# Patient Record
Sex: Female | Born: 1960 | Race: White | Hispanic: No | Marital: Married | Smoking: Never smoker
Health system: Southern US, Community
[De-identification: ages and names within clinical notes are randomized; demographics above are authoritative.]

## PROBLEM LIST (undated history)

## (undated) DIAGNOSIS — H269 Unspecified cataract: Secondary | ICD-10-CM

## (undated) DIAGNOSIS — T7840XA Allergy, unspecified, initial encounter: Secondary | ICD-10-CM

## (undated) DIAGNOSIS — M1990 Unspecified osteoarthritis, unspecified site: Secondary | ICD-10-CM

## (undated) HISTORY — PX: CATARACT EXTRACTION, BILATERAL: SHX1313

## (undated) HISTORY — DX: Unspecified osteoarthritis, unspecified site: M19.90

## (undated) HISTORY — DX: Allergy, unspecified, initial encounter: T78.40XA

## (undated) HISTORY — DX: Unspecified cataract: H26.9

---

## 2016-01-20 LAB — TSH: TSH: 2.96 (ref 0.41–5.90)

## 2016-01-20 LAB — LIPID PANEL
Cholesterol: 211 — AB (ref 0–200)
HDL: 65 (ref 35–70)
LDL Cholesterol: 131
TRIGLYCERIDES: 74 (ref 40–160)

## 2016-01-20 LAB — BASIC METABOLIC PANEL
BUN: 11 (ref 4–21)
CREATININE: 0.8 (ref 0.5–1.1)
GLUCOSE: 85
Potassium: 3.9 (ref 3.4–5.3)
SODIUM: 133 — AB (ref 137–147)

## 2017-09-17 DIAGNOSIS — M9902 Segmental and somatic dysfunction of thoracic region: Secondary | ICD-10-CM | POA: Diagnosis not present

## 2017-09-17 DIAGNOSIS — M9901 Segmental and somatic dysfunction of cervical region: Secondary | ICD-10-CM | POA: Diagnosis not present

## 2017-09-17 DIAGNOSIS — M5412 Radiculopathy, cervical region: Secondary | ICD-10-CM | POA: Diagnosis not present

## 2017-10-03 DIAGNOSIS — M722 Plantar fascial fibromatosis: Secondary | ICD-10-CM | POA: Diagnosis not present

## 2017-10-04 DIAGNOSIS — M9902 Segmental and somatic dysfunction of thoracic region: Secondary | ICD-10-CM | POA: Diagnosis not present

## 2017-10-04 DIAGNOSIS — M9901 Segmental and somatic dysfunction of cervical region: Secondary | ICD-10-CM | POA: Diagnosis not present

## 2017-10-04 DIAGNOSIS — M5412 Radiculopathy, cervical region: Secondary | ICD-10-CM | POA: Diagnosis not present

## 2017-10-30 DIAGNOSIS — M5412 Radiculopathy, cervical region: Secondary | ICD-10-CM | POA: Diagnosis not present

## 2017-10-30 DIAGNOSIS — M9902 Segmental and somatic dysfunction of thoracic region: Secondary | ICD-10-CM | POA: Diagnosis not present

## 2017-10-30 DIAGNOSIS — M9901 Segmental and somatic dysfunction of cervical region: Secondary | ICD-10-CM | POA: Diagnosis not present

## 2017-11-20 DIAGNOSIS — M5412 Radiculopathy, cervical region: Secondary | ICD-10-CM | POA: Diagnosis not present

## 2017-11-20 DIAGNOSIS — M9901 Segmental and somatic dysfunction of cervical region: Secondary | ICD-10-CM | POA: Diagnosis not present

## 2017-11-20 DIAGNOSIS — M9902 Segmental and somatic dysfunction of thoracic region: Secondary | ICD-10-CM | POA: Diagnosis not present

## 2017-12-11 DIAGNOSIS — M9901 Segmental and somatic dysfunction of cervical region: Secondary | ICD-10-CM | POA: Diagnosis not present

## 2017-12-11 DIAGNOSIS — M9902 Segmental and somatic dysfunction of thoracic region: Secondary | ICD-10-CM | POA: Diagnosis not present

## 2017-12-11 DIAGNOSIS — M5412 Radiculopathy, cervical region: Secondary | ICD-10-CM | POA: Diagnosis not present

## 2017-12-24 DIAGNOSIS — H02889 Meibomian gland dysfunction of unspecified eye, unspecified eyelid: Secondary | ICD-10-CM | POA: Diagnosis not present

## 2017-12-24 DIAGNOSIS — H26493 Other secondary cataract, bilateral: Secondary | ICD-10-CM | POA: Diagnosis not present

## 2018-01-01 DIAGNOSIS — M9902 Segmental and somatic dysfunction of thoracic region: Secondary | ICD-10-CM | POA: Diagnosis not present

## 2018-01-01 DIAGNOSIS — M5412 Radiculopathy, cervical region: Secondary | ICD-10-CM | POA: Diagnosis not present

## 2018-01-01 DIAGNOSIS — M9901 Segmental and somatic dysfunction of cervical region: Secondary | ICD-10-CM | POA: Diagnosis not present

## 2018-01-21 DIAGNOSIS — M9901 Segmental and somatic dysfunction of cervical region: Secondary | ICD-10-CM | POA: Diagnosis not present

## 2018-01-21 DIAGNOSIS — M5412 Radiculopathy, cervical region: Secondary | ICD-10-CM | POA: Diagnosis not present

## 2018-01-21 DIAGNOSIS — M9902 Segmental and somatic dysfunction of thoracic region: Secondary | ICD-10-CM | POA: Diagnosis not present

## 2018-02-11 DIAGNOSIS — M9902 Segmental and somatic dysfunction of thoracic region: Secondary | ICD-10-CM | POA: Diagnosis not present

## 2018-02-11 DIAGNOSIS — M9901 Segmental and somatic dysfunction of cervical region: Secondary | ICD-10-CM | POA: Diagnosis not present

## 2018-02-11 DIAGNOSIS — M5412 Radiculopathy, cervical region: Secondary | ICD-10-CM | POA: Diagnosis not present

## 2018-03-04 DIAGNOSIS — M9901 Segmental and somatic dysfunction of cervical region: Secondary | ICD-10-CM | POA: Diagnosis not present

## 2018-03-04 DIAGNOSIS — M5412 Radiculopathy, cervical region: Secondary | ICD-10-CM | POA: Diagnosis not present

## 2018-03-04 DIAGNOSIS — M9902 Segmental and somatic dysfunction of thoracic region: Secondary | ICD-10-CM | POA: Diagnosis not present

## 2018-03-25 DIAGNOSIS — M5412 Radiculopathy, cervical region: Secondary | ICD-10-CM | POA: Diagnosis not present

## 2018-03-25 DIAGNOSIS — M9901 Segmental and somatic dysfunction of cervical region: Secondary | ICD-10-CM | POA: Diagnosis not present

## 2018-03-25 DIAGNOSIS — M9902 Segmental and somatic dysfunction of thoracic region: Secondary | ICD-10-CM | POA: Diagnosis not present

## 2018-04-15 DIAGNOSIS — M9902 Segmental and somatic dysfunction of thoracic region: Secondary | ICD-10-CM | POA: Diagnosis not present

## 2018-04-15 DIAGNOSIS — M9901 Segmental and somatic dysfunction of cervical region: Secondary | ICD-10-CM | POA: Diagnosis not present

## 2018-04-15 DIAGNOSIS — M5412 Radiculopathy, cervical region: Secondary | ICD-10-CM | POA: Diagnosis not present

## 2018-04-25 ENCOUNTER — Ambulatory Visit (INDEPENDENT_AMBULATORY_CARE_PROVIDER_SITE_OTHER): Payer: 59 | Admitting: Family Medicine

## 2018-04-25 ENCOUNTER — Encounter: Payer: Self-pay | Admitting: Family Medicine

## 2018-04-25 VITALS — BP 118/78 | HR 74 | Temp 97.8°F | Resp 16 | Ht 62.0 in | Wt 115.8 lb

## 2018-04-25 DIAGNOSIS — Z130 Encounter for screening for diseases of the blood and blood-forming organs and certain disorders involving the immune mechanism: Secondary | ICD-10-CM

## 2018-04-25 DIAGNOSIS — Z13220 Encounter for screening for lipoid disorders: Secondary | ICD-10-CM

## 2018-04-25 DIAGNOSIS — N952 Postmenopausal atrophic vaginitis: Secondary | ICD-10-CM | POA: Diagnosis not present

## 2018-04-25 DIAGNOSIS — Z780 Asymptomatic menopausal state: Secondary | ICD-10-CM

## 2018-04-25 DIAGNOSIS — Z8262 Family history of osteoporosis: Secondary | ICD-10-CM | POA: Diagnosis not present

## 2018-04-25 DIAGNOSIS — Z803 Family history of malignant neoplasm of breast: Secondary | ICD-10-CM | POA: Insufficient documentation

## 2018-04-25 DIAGNOSIS — Z1239 Encounter for other screening for malignant neoplasm of breast: Secondary | ICD-10-CM

## 2018-04-25 DIAGNOSIS — Z1329 Encounter for screening for other suspected endocrine disorder: Secondary | ICD-10-CM

## 2018-04-25 LAB — CHLORIDE
CALCIUM: 9.1
CO2: 22
Chloride: 101

## 2018-04-25 NOTE — Progress Notes (Signed)
Patient: Rose Sandoval, Female    DOB: Dec 07, 1960, 57 y.o.   MRN: [id number] Visit Date: 04/25/2018  Today's Provider: Shirlee Latch, MD   Chief Complaint  Patient presents with  . New Patient (Initial Visit)   Subjective:    New Patient:  Rose Sandoval is a 57 y.o. female who presents today to Establish Care.  She feels well. She reports exercising includes walking. She reports she is sleeping well.  She was previously seen by Dr Sullivan Lone and then switched to [facility].  She would like to reestablish care at [facility].  She is postmenopausal.  She is using progesterone cream (OTC) for 3 weeks each month to help with hot flashes.  She is having some vaginal dryness and painful intercourse.  She is wondering about medication to treat this.  She is wary of HRT as her mother took this and had breast cancer.  She had a false positive on mammogram in the past and is wary of getting one again. She has never had DEXA and mother had osteoporosis also.  Has done FOBT colon cancer screening in 2017, but none since. -----------------------------------------------------------------   Review of Systems  Constitutional: Negative.   HENT: Negative.   Eyes: Positive for photophobia.       Cataracts-2018 both eyes  Respiratory: Negative.   Cardiovascular: Negative.   Gastrointestinal: Negative.   Endocrine: Negative.   Genitourinary: Positive for vaginal pain.  Musculoskeletal: Positive for arthralgias, back pain and neck stiffness.  Skin: Negative.   Allergic/Immunologic: Positive for environmental allergies.       Mild  Neurological: Positive for light-headedness and headaches.       Mild  Hematological: Negative.   Psychiatric/Behavioral: Negative.     Social History      She  reports that she has never smoked. She has never used smokeless tobacco. She reports that she does not drink alcohol or use drugs.       Social History   Socioeconomic History  . Marital status:  Married    Spouse name: Not on file  . Number of children: Not on file  . Years of education: Not on file  . Highest education level: Not on file  Occupational History  . Not on file  Social Needs  . Financial resource strain: Not on file  . Food insecurity:    Worry: Not on file    Inability: Not on file  . Transportation needs:    Medical: Not on file    Non-medical: Not on file  Tobacco Use  . Smoking status: Never Smoker  . Smokeless tobacco: Never Used  Substance and Sexual Activity  . Alcohol use: Never    Frequency: Never  . Drug use: Never  . Sexual activity: Not on file  Lifestyle  . Physical activity:    Days per week: Not on file    Minutes per session: Not on file  . Stress: Not on file  Relationships  . Social connections:    Talks on phone: Not on file    Gets together: Not on file    Attends religious service: Not on file    Active member of club or organization: Not on file    Attends meetings of clubs or organizations: Not on file    Relationship status: Not on file  Other Topics Concern  . Not on file  Social History Narrative  . Not on file    Past Medical History:  Diagnosis  Date  . Allergy   . Arthritis   . Cataract    2018     There are no active problems to display for this patient.   Past Surgical History:  Procedure Laterality Date  . EYE SURGERY      Family History        Family Status  Relation Name Status  . Mother  (Not Specified)  . Father  (Not Specified)  . Oneal Grout  (Not Specified)  . MGM  (Not Specified)  . PGF  (Not Specified)  . Brother  (Not Specified)  . MGF  (Not Specified)  . PGM  (Not Specified)        Her family history includes Cancer in her father, maternal grandmother, and mother; Dementia in her father and paternal grandmother; Emphysema in her maternal grandfather; Heart attack in her paternal grandfather and paternal uncle; Leukemia in her brother.      Not on File  No current outpatient  medications on file.   Patient Care Team: Erasmo Downer, MD as PCP - General (Family Medicine)      Objective:   Vitals: BP 118/78 (BP Location: Right Arm, Patient Position: Sitting, Cuff Size: Normal)   Pulse 74   Temp 97.8 F (36.6 C) (Oral)   Resp 16   Wt 115 lb 12.8 oz (52.5 kg)   SpO2 99%    Vitals:   04/25/18 1516  BP: 118/78  Pulse: 74  Resp: 16  Temp: 97.8 F (36.6 C)  TempSrc: Oral  SpO2: 99%  Weight: 115 lb 12.8 oz (52.5 kg)     Physical Exam  Constitutional: She is oriented to person, place, and time. She appears well-developed and well-nourished. No distress.  HENT:  Head: Normocephalic and atraumatic.  Right Ear: External ear normal.  Left Ear: External ear normal.  Nose: Nose normal.  Mouth/Throat: Oropharynx is clear and moist.  Eyes: Pupils are equal, round, and reactive to light. Conjunctivae and EOM are normal. No scleral icterus.  Neck: Neck supple. No thyromegaly present.  Cardiovascular: Normal rate, regular rhythm, normal heart sounds and intact distal pulses.  No murmur heard. Pulmonary/Chest: Effort normal and breath sounds normal. No respiratory distress. She has no wheezes. She has no rales.  Abdominal: Soft. Bowel sounds are normal. She exhibits no distension. There is no tenderness. There is no rebound and no guarding.  Musculoskeletal: She exhibits no edema or deformity.  Lymphadenopathy:    She has no cervical adenopathy.  Neurological: She is alert and oriented to person, place, and time.  Skin: Skin is warm and dry. Capillary refill takes less than 2 seconds. No rash noted.  Psychiatric: She has a normal mood and affect. Her behavior is normal.  Vitals reviewed.    Depression Screen PHQ 2/9 Scores 04/25/2018  PHQ - 2 Score 0  PHQ- 9 Score 0      Assessment & Plan:     Establish care  Exercise Activities and Dietary recommendations Goals   None      There is no immunization history on file for this  patient.  Health Maintenance  Topic Date Due  . Hepatitis C Screening  1961/04/27  . HIV Screening  07/05/1975  . TETANUS/TDAP  07/05/1979  . PAP SMEAR  07/04/1981  . MAMMOGRAM  07/04/2010  . COLONOSCOPY  07/04/2010  . INFLUENZA VACCINE  01/02/2018     Discussed health benefits of physical activity, and encouraged her to engage in regular exercise appropriate for her  age and condition.    Discussed importance of colon cancer screening.  Discussed options of colonoscopy, Cologuard, FOBT and given information about these options.  We will follow-up at her next visit, which will be a CPE to discuss going forward with some method of colon cancer screening at that time --------------------------------------------------------------------  Problem List Items Addressed This Visit      Genitourinary   Atrophic vaginitis - Primary    Discussed atrophic vaginitis and that it is common in postmenopausal women due to estrogen deficiency She will try coconut oil and lubrication We discussed possibility of using vaginal estrogen cream, patient is wary of any hormonal therapy, excluding over-the-counter progesterone cream she is using She will consider this again in the future if natural treatments do not help        Other   Family history of osteoporosis    Counseled patient significantly about her family history of osteoporosis She is also thin and postmenopausal puts her at risk for osteoporosis Discussed importance of weightbearing exercise Discussed calcium and vitamin D supplementation We will obtain baseline DEXA scan Pending DEXA scan results, consider further management      Relevant Orders   DG Bone Density   Family history of breast cancer    Long discussion with patient regarding importance of cancer screenings, especially mammograms, given her family history of breast cancer She does agree to mammogram, which was ordered today She will call to schedule this      Relevant  Orders   MM 3D SCREEN BREAST BILATERAL    Other Visit Diagnoses    Postmenopausal       Relevant Orders   DG Bone Density   Screening for breast cancer       Relevant Orders   MM 3D SCREEN BREAST BILATERAL   Screening for lipid disorders       Relevant Orders   Lipid panel   Comprehensive metabolic panel   Screening for thyroid disorder       Relevant Orders   TSH   Screening for deficiency anemia       Relevant Orders   CBC       Return in about 6 months (around 10/24/2018).   Addressed extensive list of chronic and acute medical problems today requiring extensive time in counseling and coordination of care.  Over half of this 45 minute visit were spent in counseling and coordinating care of multiple medical problems and importance of screenings.   The entirety of the information documented in the History of Present Illness, Review of Systems and Physical Exam were personally obtained by me. Portions of this information were initially documented by Presley RaddleNikki Walston and Dara LordsPorsha Carpenter, CMA and reviewed by me for thoroughness and accuracy.    Erasmo DownerBacigalupo, [place] Blizzard M, MD, MPH [facility] 04/25/2018 4:52 PM

## 2018-04-25 NOTE — Patient Instructions (Addendum)
For osteoporosis prevention:  Recommend regular weight bearing exercise, avoiding smoking, and adequate Ca (1262m/day) and Vit D3 (1000-2000 units daily) via diet or supplement.    Health Maintenance, Female Adopting a healthy lifestyle and getting preventive care can go a long way to promote health and wellness. Talk with your health care provider about what schedule of regular examinations is right for you. This is a good chance for you to check in with your provider about disease prevention and staying healthy. In between checkups, there are plenty of things you can do on your own. Experts have done a lot of research about which lifestyle changes and preventive measures are most likely to keep you healthy. Ask your health care provider for more information. Weight and diet Eat a healthy diet  Be sure to include plenty of vegetables, fruits, low-fat dairy products, and lean protein.  Do not eat a lot of foods high in solid fats, added sugars, or salt.  Get regular exercise. This is one of the most important things you can do for your health. ? Most adults should exercise for at least 150 minutes each week. The exercise should increase your heart rate and make you sweat (moderate-intensity exercise). ? Most adults should also do strengthening exercises at least twice a week. This is in addition to the moderate-intensity exercise.  Maintain a healthy weight  Body mass index (BMI) is a measurement that can be used to identify possible weight problems. It estimates body fat based on height and weight. Your health care provider can help determine your BMI and help you achieve or maintain a healthy weight.  For females 220years of age and older: ? A BMI below 18.5 is considered underweight. ? A BMI of 18.5 to 24.9 is normal. ? A BMI of 25 to 29.9 is considered overweight. ? A BMI of 30 and above is considered obese.  Watch levels of cholesterol and blood lipids  You should start having  your blood tested for lipids and cholesterol at 57years of age, then have this test every 5 years.  You may need to have your cholesterol levels checked more often if: ? Your lipid or cholesterol levels are high. ? You are older than 57years of age. ? You are at high risk for heart disease.  Cancer screening Lung Cancer  Lung cancer screening is recommended for adults [id number] old who are at high risk for lung cancer because of a history of smoking.  A yearly low-dose CT scan of the lungs is recommended for people who: ? Currently smoke. ? Have quit within the past 15 years. ? Have at least a 30-pack-year history of smoking. A pack year is smoking an average of one pack of cigarettes a day for 1 year.  Yearly screening should continue until it has been 15 years since you quit.  Yearly screening should stop if you develop a health problem that would prevent you from having lung cancer treatment.  Breast Cancer  Practice breast self-awareness. This means understanding how your breasts normally appear and feel.  It also means doing regular breast self-exams. Let your health care provider know about any changes, no matter how small.  If you are in your 20s or 30s, you should have a clinical breast exam (CBE) by a health care provider every 1-3 years as part of a regular health exam.  If you are 469or older, have a CBE every year. Also consider having a breast X-ray (mammogram)  every year.  If you have a family history of breast cancer, talk to your health care provider about genetic screening.  If you are at high risk for breast cancer, talk to your health care provider about having an MRI and a mammogram every year.  Breast cancer gene (BRCA) assessment is recommended for women who have family members with BRCA-related cancers. BRCA-related cancers include: ? Breast. ? Ovarian. ? Tubal. ? Peritoneal cancers.  Results of the assessment will determine the need for genetic  counseling and BRCA1 and BRCA2 testing.  Cervical Cancer Your health care provider may recommend that you be screened regularly for cancer of the pelvic organs (ovaries, uterus, and vagina). This screening involves a pelvic examination, including checking for microscopic changes to the surface of your cervix (Pap test). You may be encouraged to have this screening done every 3 years, beginning at age 55.  For women ages 39-65, health care providers may recommend pelvic exams and Pap testing every 3 years, or they may recommend the Pap and pelvic exam, combined with testing for human papilloma virus (HPV), every 5 years. Some types of HPV increase your risk of cervical cancer. Testing for HPV may also be done on women of any age with unclear Pap test results.  Other health care providers may not recommend any screening for nonpregnant women who are considered low risk for pelvic cancer and who do not have symptoms. Ask your health care provider if a screening pelvic exam is right for you.  If you have had past treatment for cervical cancer or a condition that could lead to cancer, you need Pap tests and screening for cancer for at least 20 years after your treatment. If Pap tests have been discontinued, your risk factors (such as having a new sexual partner) need to be reassessed to determine if screening should resume. Some women have medical problems that increase the chance of getting cervical cancer. In these cases, your health care provider may recommend more frequent screening and Pap tests.  Colorectal Cancer  This type of cancer can be detected and often prevented.  Routine colorectal cancer screening usually begins at 57 years of age and continues through 57 years of age.  Your health care provider may recommend screening at an earlier age if you have risk factors for colon cancer.  Your health care provider may also recommend using home test kits to check for hidden blood in the  stool.  A small camera at the end of a tube can be used to examine your colon directly (sigmoidoscopy or colonoscopy). This is done to check for the earliest forms of colorectal cancer.  Routine screening usually begins at age 1.  Direct examination of the colon should be repeated every 5-10 years through 57 years of age. However, you may need to be screened more often if early forms of precancerous polyps or small growths are found.  Skin Cancer  Check your skin from head to toe regularly.  Tell your health care provider about any new moles or changes in moles, especially if there is a change in a mole's shape or color.  Also tell your health care provider if you have a mole that is larger than the size of a pencil eraser.  Always use sunscreen. Apply sunscreen liberally and repeatedly throughout the day.  Protect yourself by wearing long sleeves, pants, a wide-brimmed hat, and sunglasses whenever you are outside.  Heart disease, diabetes, and high blood pressure  High blood pressure causes  heart disease and increases the risk of stroke. High blood pressure is more likely to develop in: ? People who have blood pressure in the high end of the normal range (130-139/85-89 mm Hg). ? People who are overweight or obese. ? People who are African American.  If you are 5-16 years of age, have your blood pressure checked every 3-5 years. If you are 49 years of age or older, have your blood pressure checked every year. You should have your blood pressure measured twice-once when you are at a hospital or clinic, and once when you are not at a hospital or clinic. Record the average of the two measurements. To check your blood pressure when you are not at a hospital or clinic, you can use: ? An automated blood pressure machine at a pharmacy. ? A home blood pressure monitor.  If you are between 13 years and 48 years old, ask your health care provider if you should take aspirin to prevent  strokes.  Have regular diabetes screenings. This involves taking a blood sample to check your fasting blood sugar level. ? If you are at a normal weight and have a low risk for diabetes, have this test once every three years after 57 years of age. ? If you are overweight and have a high risk for diabetes, consider being tested at a younger age or more often. Preventing infection Hepatitis B  If you have a higher risk for hepatitis B, you should be screened for this virus. You are considered at high risk for hepatitis B if: ? You were born in a country where hepatitis B is common. Ask your health care provider which countries are considered high risk. ? Your parents were born in a high-risk country, and you have not been immunized against hepatitis B (hepatitis B vaccine). ? You have HIV or AIDS. ? You use needles to inject street drugs. ? You live with someone who has hepatitis B. ? You have had sex with someone who has hepatitis B. ? You get hemodialysis treatment. ? You take certain medicines for conditions, including cancer, organ transplantation, and autoimmune conditions.  Hepatitis C  Blood testing is recommended for: ? Everyone born from [birth year] through 1965. ? Anyone with known risk factors for hepatitis C.  Sexually transmitted infections (STIs)  You should be screened for sexually transmitted infections (STIs) including gonorrhea and chlamydia if: ? You are sexually active and are younger than 57 years of age. ? You are older than 57 years of age and your health care provider tells you that you are at risk for this type of infection. ? Your sexual activity has changed since you were last screened and you are at an increased risk for chlamydia or gonorrhea. Ask your health care provider if you are at risk.  If you do not have HIV, but are at risk, it may be recommended that you take a prescription medicine daily to prevent HIV infection. This is called pre-exposure prophylaxis  (PrEP). You are considered at risk if: ? You are sexually active and do not regularly use condoms or know the HIV status of your partner(s). ? You take drugs by injection. ? You are sexually active with a partner who has HIV.  Talk with your health care provider about whether you are at high risk of being infected with HIV. If you choose to begin PrEP, you should first be tested for HIV. You should then be tested every 3 months for as long as  you are taking PrEP. Pregnancy  If you are premenopausal and you may become pregnant, ask your health care provider about preconception counseling.  If you may become pregnant, take 400 to 800 micrograms (mcg) of folic acid every day.  If you want to prevent pregnancy, talk to your health care provider about birth control (contraception). Osteoporosis and menopause  Osteoporosis is a disease in which the bones lose minerals and strength with aging. This can result in serious bone fractures. Your risk for osteoporosis can be identified using a bone density scan.  If you are 78 years of age or older, or if you are at risk for osteoporosis and fractures, ask your health care provider if you should be screened.  Ask your health care provider whether you should take a calcium or vitamin D supplement to lower your risk for osteoporosis.  Menopause may have certain physical symptoms and risks.  Hormone replacement therapy may reduce some of these symptoms and risks. Talk to your health care provider about whether hormone replacement therapy is right for you. Follow these instructions at home:  Schedule regular health, dental, and eye exams.  Stay current with your immunizations.  Do not use any tobacco products including cigarettes, chewing tobacco, or electronic cigarettes.  If you are pregnant, do not drink alcohol.  If you are breastfeeding, limit how much and how often you drink alcohol.  Limit alcohol intake to no more than 1 drink per day for  nonpregnant women. One drink equals 12 ounces of beer, 5 ounces of wine, or 1 ounces of hard liquor.  Do not use street drugs.  Do not share needles.  Ask your health care provider for help if you need support or information about quitting drugs.  Tell your health care provider if you often feel depressed.  Tell your health care provider if you have ever been abused or do not feel safe at home. This information is not intended to replace advice given to you by your health care provider. Make sure you discuss any questions you have with your health care provider. Document Released: 12/04/2010 Document Revised: 10/27/2015 Document Reviewed: 02/22/2015 Elsevier Interactive Patient Education  2018 [employer].    Atrophic Vaginitis Atrophic vaginitis is a condition in which the tissues that line the vagina become dry and thin. This condition is most common in women who have stopped having regular menstrual periods (menopause). This usually starts when a woman is 56-64 years old. Estrogen helps to keep the vagina moist. It stimulates the vagina to produce a clear fluid that lubricates the vagina for sexual intercourse. This fluid also protects the vagina from infection. Lack of estrogen can cause the lining of the vagina to get thinner and dryer. The vagina may also shrink in size. It may become less elastic. Atrophic vaginitis tends to get worse over time as a woman's estrogen level drops. What are the causes? This condition is caused by the normal drop in estrogen that happens around the time of menopause. What increases the risk? Certain conditions or situations may lower a woman's estrogen level, which increases her risk of atrophic vaginitis. These include:  Taking medicine that blocks estrogen.  Having ovaries removed surgically.  Being treated for cancer with X-ray treatment (radiation) or medicines (chemotherapy).  Exercising very hard and often.  Having an eating disorder  (anorexia).  Giving birth or breastfeeding.  Being over the age of 17.  Smoking.  What are the signs or symptoms? Symptoms of this condition include:  Pain, soreness, or bleeding during sexual intercourse (dyspareunia).  Vaginal burning, irritation, or itching.  Pain or bleeding during a vaginal examination using a speculum (pelvic exam).  Loss of interest in sexual activity.  Having burning pain when passing urine.  Vaginal discharge that is brown or yellow.  In some cases, there are no symptoms. How is this diagnosed? This condition is diagnosed with a medical history and physical exam. This will include a pelvic exam that checks whether the inside of your vagina appears pale, thin, or dry. Rarely, you may also have other tests, including:  A urine test.  A test that checks the acid balance in your vaginal fluid (acid balance test).  How is this treated? Treatment for this condition may depend on the severity of your symptoms. Treatment may include:  Using an over-the-counter vaginal lubricant before you have sexual intercourse.  Using a long-acting vaginal moisturizer.  Using low-dose vaginal estrogen for moderate to severe symptoms that do not respond to other treatments. Options include creams, tablets, and inserts (vaginal rings). Before using vaginal estrogen, tell your health care provider if you have a history of: ? Breast cancer. ? Endometrial cancer. ? Blood clots.  Taking medicines. You may be able to take a daily pill for dyspareunia. Discuss all of the risks of this medicine with your health care provider. It is usually not recommended for women who have a family history or personal history of breast cancer.  If your symptoms are very mild and you are not sexually active, you may not need treatment. Follow these instructions at home:  Take medicines only as directed by your health care provider. Do not use herbal or alternative medicines unless your  health care provider says that you can.  Use over-the-counter creams, lubricants, or moisturizers for dryness only as directed by your health care provider.  If your atrophic vaginitis is caused by menopause, discuss all of your menopausal symptoms and treatment options with your health care provider.  Do not douche.  Do not use products that can make your vagina dry. These include: ? Scented feminine sprays. ? Scented tampons. ? Scented soaps.  If it hurts to have sex, talk with your sexual partner. Contact a health care provider if:  Your discharge looks different than normal.  Your vagina has an unusual smell.  You have new symptoms.  Your symptoms do not improve with treatment.  Your symptoms get worse. This information is not intended to replace advice given to you by your health care provider. Make sure you discuss any questions you have with your health care provider. Document Released: 10/05/2014 Document Revised: 10/27/2015 Document Reviewed: 05/12/2014 Elsevier Interactive Patient Education  [employer].  Colonoscopy, Adult A colonoscopy is an exam to look at the entire large intestine. During the exam, a lubricated, bendable tube is inserted into the anus and then passed into the rectum, colon, and other parts of the large intestine. A colonoscopy is often done as a part of normal colorectal screening or in response to certain symptoms, such as anemia, persistent diarrhea, abdominal pain, and blood in the stool. The exam can help screen for and diagnose medical problems, including:  Tumors.  Polyps.  Inflammation.  Areas of bleeding.  Tell a health care provider about:  Any allergies you have.  All medicines you are taking, including vitamins, herbs, eye drops, creams, and over-the-counter medicines.  Any problems you or family members have had with anesthetic medicines.  Any blood disorders you have.  Any surgeries you have had.  Any medical  conditions you have.  Any problems you have had passing stool. What are the risks? Generally, this is a safe procedure. However, problems may occur, including:  Bleeding.  A tear in the intestine.  A reaction to medicines given during the exam.  Infection (rare).  What happens before the procedure? Eating and drinking restrictions Follow instructions from your health care provider about eating and drinking, which may include:  A few days before the procedure - follow a low-fiber diet. Avoid nuts, seeds, dried fruit, raw fruits, and vegetables.  1-3 days before the procedure - follow a clear liquid diet. Drink only clear liquids, such as clear broth or bouillon, black coffee or tea, clear juice, clear soft drinks or sports drinks, gelatin dessert, and popsicles. Avoid any liquids that contain red or purple dye.  On the day of the procedure - do not eat or drink anything during the 2 hours before the procedure, or within the time period that your health care provider recommends.  Bowel prep If you were prescribed an oral bowel prep to clean out your colon:  Take it as told by your health care provider. Starting the day before your procedure, you will need to drink a large amount of medicated liquid. The liquid will cause you to have multiple loose stools until your stool is almost clear or light green.  If your skin or anus gets irritated from diarrhea, you may use these to relieve the irritation: ? Medicated wipes, such as adult wet wipes with aloe and vitamin E. ? A skin soothing-product like petroleum jelly.  If you vomit while drinking the bowel prep, take a break for up to 60 minutes and then begin the bowel prep again. If vomiting continues and you cannot take the bowel prep without vomiting, call your health care provider.  General instructions  Ask your health care provider about changing or stopping your regular medicines. This is especially important if you are taking  diabetes medicines or blood thinners.  Plan to have someone take you home from the hospital or clinic. What happens during the procedure?  An IV tube may be inserted into one of your veins.  You will be given medicine to help you relax (sedative).  To reduce your risk of infection: ? Your health care team will wash or sanitize their hands. ? Your anal area will be washed with soap.  You will be asked to lie on your side with your knees bent.  Your health care provider will lubricate a long, thin, flexible tube. The tube will have a camera and a light on the end.  The tube will be inserted into your anus.  The tube will be gently eased through your rectum and colon.  Air will be delivered into your colon to keep it open. You may feel some pressure or cramping.  The camera will be used to take images during the procedure.  A small tissue sample may be removed from your body to be examined under a microscope (biopsy). If any potential problems are found, the tissue will be sent to a lab for testing.  If small polyps are found, your health care provider may remove them and have them checked for cancer cells.  The tube that was inserted into your anus will be slowly removed. The procedure may vary among health care providers and hospitals. What happens after the procedure?  Your blood pressure, heart rate, breathing rate, and blood  oxygen level will be monitored until the medicines you were given have worn off.  Do not drive for 24 hours after the exam.  You may have a small amount of blood in your stool.  You may pass gas and have mild abdominal cramping or bloating due to the air that was used to inflate your colon during the exam.  It is up to you to get the results of your procedure. Ask your health care provider, or the department performing the procedure, when your results will be ready. This information is not intended to replace advice given to you by your health care  provider. Make sure you discuss any questions you have with your health care provider. Document Released: 05/18/2000 Document Revised: 03/21/2016 Document Reviewed: 08/02/2015 Elsevier Interactive Patient Education  2018 [employer].

## 2018-04-25 NOTE — Assessment & Plan Note (Signed)
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

## 2018-04-25 NOTE — Assessment & Plan Note (Signed)
Long discussion with patient regarding importance of cancer screenings, especially mammograms, given her family history of breast cancer She does agree to mammogram, which was ordered today She will call to schedule this

## 2018-04-25 NOTE — Assessment & Plan Note (Signed)
Counseled patient significantly about her family history of osteoporosis She is also thin and postmenopausal puts her at risk for osteoporosis Discussed importance of weightbearing exercise Discussed calcium and vitamin D supplementation We will obtain baseline DEXA scan Pending DEXA scan results, consider further management

## 2018-05-06 DIAGNOSIS — M9901 Segmental and somatic dysfunction of cervical region: Secondary | ICD-10-CM | POA: Diagnosis not present

## 2018-05-06 DIAGNOSIS — M5412 Radiculopathy, cervical region: Secondary | ICD-10-CM | POA: Diagnosis not present

## 2018-05-06 DIAGNOSIS — M9902 Segmental and somatic dysfunction of thoracic region: Secondary | ICD-10-CM | POA: Diagnosis not present

## 2018-05-16 DIAGNOSIS — Z130 Encounter for screening for diseases of the blood and blood-forming organs and certain disorders involving the immune mechanism: Secondary | ICD-10-CM | POA: Diagnosis not present

## 2018-05-16 DIAGNOSIS — Z1329 Encounter for screening for other suspected endocrine disorder: Secondary | ICD-10-CM | POA: Diagnosis not present

## 2018-05-16 DIAGNOSIS — Z13220 Encounter for screening for lipoid disorders: Secondary | ICD-10-CM | POA: Diagnosis not present

## 2018-05-17 LAB — CBC
Hematocrit: 38.5 % (ref 34.0–46.6)
Hemoglobin: 12.1 g/dL (ref 11.1–15.9)
MCH: 28.1 pg (ref 26.6–33.0)
MCHC: 31.4 g/dL — ABNORMAL LOW (ref 31.5–35.7)
MCV: 90 fL (ref 79–97)
Platelets: 279 10*3/uL (ref 150–450)
RBC: 4.3 x10E6/uL (ref 3.77–5.28)
RDW: 12.1 % — ABNORMAL LOW (ref 12.3–15.4)
WBC: 5.1 10*3/uL (ref 3.4–10.8)

## 2018-05-17 LAB — COMPREHENSIVE METABOLIC PANEL
ALBUMIN: 4.2 g/dL (ref 3.5–5.5)
ALT: 15 IU/L (ref 0–32)
AST: 16 IU/L (ref 0–40)
Albumin/Globulin Ratio: 1.7 (ref 1.2–2.2)
Alkaline Phosphatase: 59 IU/L (ref 39–117)
BUN / CREAT RATIO: 21 (ref 9–23)
BUN: 15 mg/dL (ref 6–24)
Bilirubin Total: 0.3 mg/dL (ref 0.0–1.2)
CALCIUM: 9.4 mg/dL (ref 8.7–10.2)
CO2: 22 mmol/L (ref 20–29)
CREATININE: 0.73 mg/dL (ref 0.57–1.00)
Chloride: 103 mmol/L (ref 96–106)
GFR calc Af Amer: 106 mL/min/{1.73_m2} (ref >59)
GFR, EST NON AFRICAN AMERICAN: 92 mL/min/{1.73_m2} (ref >59)
GLOBULIN, TOTAL: 2.5 g/dL (ref 1.5–4.5)
Glucose: 90 mg/dL (ref 65–99)
Potassium: 4.3 mmol/L (ref 3.5–5.2)
SODIUM: 139 mmol/L (ref 134–144)
Total Protein: 6.7 g/dL (ref 6.0–8.5)

## 2018-05-17 LAB — LIPID PANEL
CHOLESTEROL TOTAL: 187 mg/dL (ref 100–199)
Chol/HDL Ratio: 2.7 ratio (ref 0.0–4.4)
HDL: 69 mg/dL (ref >39)
LDL Calculated: 100 mg/dL — ABNORMAL HIGH (ref 0–99)
Triglycerides: 91 mg/dL (ref 0–149)
VLDL Cholesterol Cal: 18 mg/dL (ref 5–40)

## 2018-05-17 LAB — TSH: TSH: 1.66 u[IU]/mL (ref 0.450–4.500)

## 2018-05-19 ENCOUNTER — Telehealth: Payer: Self-pay

## 2018-05-19 NOTE — Telephone Encounter (Signed)
-----   Message from Erasmo DownerAngela M Bacigalupo, MD sent at 05/19/2018  9:43 AM EST ----- Cholesterol has improved and is in a good range.  Normal kidney function, liver function, electrolytes, Blood counts, Thyroid function

## 2018-05-19 NOTE — Telephone Encounter (Signed)
LVMTRC 

## 2018-05-19 NOTE — Telephone Encounter (Signed)
Patient advised as below.  

## 2018-05-30 DIAGNOSIS — M9901 Segmental and somatic dysfunction of cervical region: Secondary | ICD-10-CM | POA: Diagnosis not present

## 2018-05-30 DIAGNOSIS — M5412 Radiculopathy, cervical region: Secondary | ICD-10-CM | POA: Diagnosis not present

## 2018-05-30 DIAGNOSIS — M9902 Segmental and somatic dysfunction of thoracic region: Secondary | ICD-10-CM | POA: Diagnosis not present

## 2018-06-24 DIAGNOSIS — M9902 Segmental and somatic dysfunction of thoracic region: Secondary | ICD-10-CM | POA: Diagnosis not present

## 2018-06-24 DIAGNOSIS — M9901 Segmental and somatic dysfunction of cervical region: Secondary | ICD-10-CM | POA: Diagnosis not present

## 2018-06-24 DIAGNOSIS — M5412 Radiculopathy, cervical region: Secondary | ICD-10-CM | POA: Diagnosis not present

## 2018-07-16 ENCOUNTER — Other Ambulatory Visit: Payer: Self-pay

## 2018-07-22 DIAGNOSIS — M9902 Segmental and somatic dysfunction of thoracic region: Secondary | ICD-10-CM | POA: Diagnosis not present

## 2018-07-22 DIAGNOSIS — M5412 Radiculopathy, cervical region: Secondary | ICD-10-CM | POA: Diagnosis not present

## 2018-07-22 DIAGNOSIS — M9901 Segmental and somatic dysfunction of cervical region: Secondary | ICD-10-CM | POA: Diagnosis not present

## 2018-08-19 DIAGNOSIS — M9902 Segmental and somatic dysfunction of thoracic region: Secondary | ICD-10-CM | POA: Diagnosis not present

## 2018-08-19 DIAGNOSIS — M5412 Radiculopathy, cervical region: Secondary | ICD-10-CM | POA: Diagnosis not present

## 2018-08-19 DIAGNOSIS — M9901 Segmental and somatic dysfunction of cervical region: Secondary | ICD-10-CM | POA: Diagnosis not present

## 2018-08-27 ENCOUNTER — Other Ambulatory Visit: Payer: Self-pay

## 2018-09-09 DIAGNOSIS — M9902 Segmental and somatic dysfunction of thoracic region: Secondary | ICD-10-CM | POA: Diagnosis not present

## 2018-09-09 DIAGNOSIS — M9901 Segmental and somatic dysfunction of cervical region: Secondary | ICD-10-CM | POA: Diagnosis not present

## 2018-09-09 DIAGNOSIS — M5412 Radiculopathy, cervical region: Secondary | ICD-10-CM | POA: Diagnosis not present

## 2018-10-02 ENCOUNTER — Other Ambulatory Visit: Payer: Self-pay

## 2018-11-10 ENCOUNTER — Encounter: Payer: Self-pay | Admitting: Family Medicine

## 2018-11-10 ENCOUNTER — Other Ambulatory Visit: Payer: Self-pay

## 2018-11-10 ENCOUNTER — Ambulatory Visit (INDEPENDENT_AMBULATORY_CARE_PROVIDER_SITE_OTHER): Payer: 59 | Admitting: Family Medicine

## 2018-11-10 ENCOUNTER — Other Ambulatory Visit (HOSPITAL_COMMUNITY)
Admission: RE | Admit: 2018-11-10 | Discharge: 2018-11-10 | Disposition: A | Discharge status: Home / Self Care | Payer: 59 | Source: Ambulatory Visit | Attending: Family Medicine | Admitting: Family Medicine

## 2018-11-10 VITALS — BP 91/66 | HR 80 | Temp 98.0°F | Ht 62.0 in | Wt 117.4 lb

## 2018-11-10 DIAGNOSIS — Z124 Encounter for screening for malignant neoplasm of cervix: Secondary | ICD-10-CM | POA: Insufficient documentation

## 2018-11-10 DIAGNOSIS — Z1211 Encounter for screening for malignant neoplasm of colon: Secondary | ICD-10-CM | POA: Diagnosis not present

## 2018-11-10 DIAGNOSIS — B351 Tinea unguium: Secondary | ICD-10-CM | POA: Diagnosis not present

## 2018-11-10 DIAGNOSIS — Z0000 Encounter for general adult medical examination without abnormal findings: Secondary | ICD-10-CM

## 2018-11-10 MED ORDER — EFINACONAZOLE 10 % EX SOLN: CUTANEOUS | 5 refills | Status: DC

## 2018-11-10 NOTE — Progress Notes (Signed)
Patient: Rose Sandoval, Female    DOB: 10-17-1960, 58 y.o.   MRN: [id number] Visit Date: 11/10/2018  Today's Provider: Lavon Paganini, MD   Chief Complaint  Patient presents with  . Annual Exam   Subjective:     Annual physical exam Rose Sandoval is a 58 y.o. female who presents today for health maintenance and complete physical. She feels well. She reports exercising lightly. She reports she is sleeping fairly well.   Has mammogram and DEXA scheduled for later this month - was postponed due to COVID pandemic  Last pap smear many years  Has never had colonoscopy.  Thinks she did FOBT x3 several yearas ago and it was negative. -----------------------------------------------------------------   Review of Systems  Constitutional: Negative.   HENT: Negative.   Eyes: Negative.   Respiratory: Negative.   Cardiovascular: Negative.   Gastrointestinal: Negative.   Endocrine: Negative.   Genitourinary: Negative.   Musculoskeletal: Negative.   Skin: Negative.   Allergic/Immunologic: Negative.   Neurological: Negative.   Hematological: Negative.   Psychiatric/Behavioral: Negative.     Social History      She  reports that she has never smoked. She has never used smokeless tobacco. She reports that she does not drink alcohol or use drugs.       Social History   Socioeconomic History  . Marital status: Married    Spouse name: Not on file  . Number of children: Not on file  . Years of education: Not on file  . Highest education level: Not on file  Occupational History  . Occupation: Pharmacist, hospital (middle school)  Social Needs  . Financial resource strain: Not on file  . Food insecurity:    Worry: Not on file    Inability: Not on file  . Transportation needs:    Medical: Not on file    Non-medical: Not on file  Tobacco Use  . Smoking status: Never Smoker  . Smokeless tobacco: Never Used  Substance and Sexual Activity  . Alcohol use: Never    Frequency: Never   . Drug use: Never  . Sexual activity: Not on file  Lifestyle  . Physical activity:    Days per week: Not on file    Minutes per session: Not on file  . Stress: Not on file  Relationships  . Social connections:    Talks on phone: Not on file    Gets together: Not on file    Attends religious service: Not on file    Active member of club or organization: Not on file    Attends meetings of clubs or organizations: Not on file    Relationship status: Not on file  Other Topics Concern  . Not on file  Social History Narrative  . Not on file    Past Medical History:  Diagnosis Date  . Allergy   . Arthritis   . Cataract    2018     Patient Active Problem List   Diagnosis Date Noted  . Atrophic vaginitis 04/25/2018  . Family history of osteoporosis 04/25/2018  . Family history of breast cancer 04/25/2018    Past Surgical History:  Procedure Laterality Date  . CATARACT EXTRACTION, BILATERAL     with lens replacement    Family History        Family Status  Relation Name Status  . Mother  Alive  . Father  Deceased  . Annamarie Major  (Not Specified)  . MGM  (  Not Specified)  . PGF  (Not Specified)  . Brother  (Not Specified)  . MGF  (Not Specified)  . PGM  (Not Specified)        Her family history includes Breast cancer in her mother; Cancer in her maternal grandmother; Dementia in her father and paternal grandmother; Emphysema in her maternal grandfather; Heart attack in her paternal grandfather and paternal uncle; Leukemia in her brother; Osteoporosis in her mother; Prostate cancer in her father.      No Known Allergies   Current Outpatient Medications:  .  Progesterone Micronized 10 % CREA, Place 1 application onto the skin daily., Disp: , Rfl:  .  Efinaconazole 10 % SOLN, Apply topically to affected toenails daily for 48 weeks, Disp: 8 mL, Rfl: 5   Patient Care Team: Erasmo DownerBacigalupo, Raymona Boss M, MD as PCP - General (Family Medicine)    Objective:    Vitals: BP 91/66  (BP Location: Right Arm, Patient Position: Sitting, Cuff Size: Normal)   Pulse 80   Temp 98 F (36.7 C) (Oral)   Ht 5\' 2"  (1.575 m)   Wt 117 lb 6.4 oz (53.3 kg)   SpO2 99%   BMI 21.47 kg/m    Vitals:   11/10/18 1008  BP: 91/66  Pulse: 80  Temp: 98 F (36.7 C)  TempSrc: Oral  SpO2: 99%  Weight: 117 lb 6.4 oz (53.3 kg)  Height: 5\' 2"  (1.575 m)     Physical Exam Vitals signs reviewed.  Constitutional:      General: She is not in acute distress.    Appearance: Normal appearance. She is well-developed. She is not diaphoretic.  HENT:     Head: Normocephalic and atraumatic.     Right Ear: Tympanic membrane, ear canal and external ear normal.     Left Ear: Tympanic membrane, ear canal and external ear normal.     Nose: Nose normal.     Mouth/Throat:     Mouth: Mucous membranes are moist.     Pharynx: Oropharynx is clear. No oropharyngeal exudate.  Eyes:     General: No scleral icterus.    Conjunctiva/sclera: Conjunctivae normal.     Pupils: Pupils are equal, round, and reactive to light.  Neck:     Musculoskeletal: Neck supple.     Thyroid: No thyromegaly.  Cardiovascular:     Rate and Rhythm: Normal rate and regular rhythm.     Pulses: Normal pulses.     Heart sounds: Normal heart sounds. No murmur.  Pulmonary:     Effort: Pulmonary effort is normal. No respiratory distress.     Breath sounds: Normal breath sounds. No wheezing or rales.  Abdominal:     General: Bowel sounds are normal. There is no distension.     Palpations: Abdomen is soft.     Tenderness: There is no abdominal tenderness. There is no guarding or rebound.  Musculoskeletal:        General: No deformity.     Right lower leg: No edema.     Left lower leg: No edema.  Lymphadenopathy:     Cervical: No cervical adenopathy.  Skin:    General: Skin is warm and dry.     Capillary Refill: Capillary refill takes less than 2 seconds.     Findings: No rash.     Comments: Small sebs over legs Thickened and  discolored 2nd toenails on bilateral feet  Neurological:     Mental Status: She is alert and oriented to person, place,  and time. Mental status is at baseline.  Psychiatric:        Mood and Affect: Mood normal.        Behavior: Behavior normal.        Thought Content: Thought content normal.     Depression Screen PHQ 2/9 Scores 11/10/2018 04/25/2018  PHQ - 2 Score 0 0  PHQ- 9 Score 1 0      Assessment & Plan:     Routine Health Maintenance and Physical Exam  Exercise Activities and Dietary recommendations Goals   None      There is no immunization history on file for this patient.  Health Maintenance  Topic Date Due  . Hepatitis C Screening  Apr [birth year], 1961  . HIV Screening  07/05/1975  . TETANUS/TDAP  07/05/1979  . PAP SMEAR-Modifier  07/04/1981  . MAMMOGRAM  07/04/2010  . COLONOSCOPY  07/04/2010  . INFLUENZA VACCINE  01/03/2019     Discussed health benefits of physical activity, and encouraged her to engage in regular exercise appropriate for her age and condition.    --------------------------------------------------------------------  Problem List Items Addressed This Visit    None    Visit Diagnoses    Encounter for annual physical exam    -  Primary   Screen for colon cancer       Relevant Orders   Ambulatory referral to Gastroenterology   Screening for cervical cancer       Relevant Orders   Cytology - PAP   Onychomycosis       Relevant Medications   Efinaconazole 10 % SOLN       Return in about 1 year (around 11/10/2019) for CPE.   The entirety of the information documented in the History of Present Illness, Review of Systems and Physical Exam were personally obtained by me. Portions of this information were initially documented by Presley RaddleNikki Walston, CMA and reviewed by me for thoroughness and accuracy.    Nataley Bahri, Marzella SchleinAngela M, MD MPH [facility]

## 2018-11-10 NOTE — Patient Instructions (Signed)
Preventive Care 40-64 Years, Female Preventive care refers to lifestyle choices and visits with your health care provider that can promote health and wellness. What does preventive care include?   A yearly physical exam. This is also called an annual well check.  Dental exams once or twice a year.  Routine eye exams. Ask your health care provider how often you should have your eyes checked.  Personal lifestyle choices, including: ? Daily care of your teeth and gums. ? Regular physical activity. ? Eating a healthy diet. ? Avoiding tobacco and drug use. ? Limiting alcohol use. ? Practicing safe sex. ? Taking low-dose aspirin daily starting at age 50. ? Taking vitamin and mineral supplements as recommended by your health care provider. What happens during an annual well check? The services and screenings done by your health care provider during your annual well check will depend on your age, overall health, lifestyle risk factors, and family history of disease. Counseling Your health care provider may ask you questions about your:  Alcohol use.  Tobacco use.  Drug use.  Emotional well-being.  Home and relationship well-being.  Sexual activity.  Eating habits.  Work and work environment.  Method of birth control.  Menstrual cycle.  Pregnancy history. Screening You may have the following tests or measurements:  Height, weight, and BMI.  Blood pressure.  Lipid and cholesterol levels. These may be checked every 5 years, or more frequently if you are over 50 years old.  Skin check.  Lung cancer screening. You may have this screening every year starting at age 55 if you have a 30-pack-year history of smoking and currently smoke or have quit within the past 15 years.  Colorectal cancer screening. All adults should have this screening starting at age 50 and continuing until age 75. Your health care provider may recommend screening at age 45. You will have tests every  1-10 years, depending on your results and the type of screening test. People at increased risk should start screening at an earlier age. Screening tests may include: ? Guaiac-based fecal occult blood testing. ? Fecal immunochemical test (FIT). ? Stool DNA test. ? Virtual colonoscopy. ? Sigmoidoscopy. During this test, a flexible tube with a tiny camera (sigmoidoscope) is used to examine your rectum and lower colon. The sigmoidoscope is inserted through your anus into your rectum and lower colon. ? Colonoscopy. During this test, a long, thin, flexible tube with a tiny camera (colonoscope) is used to examine your entire colon and rectum.  Hepatitis C blood test.  Hepatitis B blood test.  Sexually transmitted disease (STD) testing.  Diabetes screening. This is done by checking your blood sugar (glucose) after you have not eaten for a while (fasting). You may have this done every 1-3 years.  Mammogram. This may be done every 1-2 years. Talk to your health care provider about when you should start having regular mammograms. This may depend on whether you have a family history of breast cancer.  BRCA-related cancer screening. This may be done if you have a family history of breast, ovarian, tubal, or peritoneal cancers.  Pelvic exam and Pap test. This may be done every 3 years starting at age 21. Starting at age 30, this may be done every 5 years if you have a Pap test in combination with an HPV test.  Bone density scan. This is done to screen for osteoporosis. You may have this scan if you are at high risk for osteoporosis. Discuss your test results, treatment options,   and if necessary, the need for more tests with your health care provider. Vaccines Your health care provider may recommend certain vaccines, such as:  Influenza vaccine. This is recommended every year.  Tetanus, diphtheria, and acellular pertussis (Tdap, Td) vaccine. You may need a Td booster every 10 years.  Varicella  vaccine. You may need this if you have not been vaccinated.  Zoster vaccine. You may need this after age 38.  Measles, mumps, and rubella (MMR) vaccine. You may need at least one dose of MMR if you were born in 1957 or later. You may also need a second dose.  Pneumococcal 13-valent conjugate (PCV13) vaccine. You may need this if you have certain conditions and were not previously vaccinated.  Pneumococcal polysaccharide (PPSV23) vaccine. You may need one or two doses if you smoke cigarettes or if you have certain conditions.  Meningococcal vaccine. You may need this if you have certain conditions.  Hepatitis A vaccine. You may need this if you have certain conditions or if you travel or work in places where you may be exposed to hepatitis A.  Hepatitis B vaccine. You may need this if you have certain conditions or if you travel or work in places where you may be exposed to hepatitis B.  Haemophilus influenzae type b (Hib) vaccine. You may need this if you have certain conditions. Talk to your health care provider about which screenings and vaccines you need and how often you need them. This information is not intended to replace advice given to you by your health care provider. Make sure you discuss any questions you have with your health care provider. Document Released: 06/17/2015 Document Revised: 07/11/2017 Document Reviewed: 03/22/2015 Elsevier Interactive Patient Education  2019 [employer].

## 2018-11-11 LAB — CYTOLOGY - PAP
Diagnosis: NEGATIVE
HPV: NOT DETECTED

## 2018-11-21 ENCOUNTER — Ambulatory Visit
Admission: RE | Admit: 2018-11-21 | Discharge: 2018-11-21 | Disposition: A | Discharge status: Home / Self Care | Payer: 59 | Source: Ambulatory Visit | Attending: Family Medicine | Admitting: Family Medicine

## 2018-11-21 ENCOUNTER — Other Ambulatory Visit: Payer: Self-pay

## 2018-11-21 DIAGNOSIS — Z8262 Family history of osteoporosis: Secondary | ICD-10-CM | POA: Insufficient documentation

## 2018-11-21 DIAGNOSIS — Z803 Family history of malignant neoplasm of breast: Secondary | ICD-10-CM | POA: Diagnosis present

## 2018-11-21 DIAGNOSIS — Z1239 Encounter for other screening for malignant neoplasm of breast: Secondary | ICD-10-CM | POA: Diagnosis present

## 2018-11-21 DIAGNOSIS — Z780 Asymptomatic menopausal state: Secondary | ICD-10-CM | POA: Diagnosis present

## 2018-11-24 ENCOUNTER — Other Ambulatory Visit: Payer: Self-pay | Admitting: Family Medicine

## 2018-11-24 DIAGNOSIS — R921 Mammographic calcification found on diagnostic imaging of breast: Secondary | ICD-10-CM

## 2018-11-24 DIAGNOSIS — R928 Other abnormal and inconclusive findings on diagnostic imaging of breast: Secondary | ICD-10-CM

## 2018-12-03 ENCOUNTER — Ambulatory Visit
Admission: RE | Admit: 2018-12-03 | Discharge: 2018-12-03 | Disposition: A | Discharge status: Home / Self Care | Payer: 59 | Source: Ambulatory Visit | Attending: Family Medicine | Admitting: Family Medicine

## 2018-12-03 ENCOUNTER — Other Ambulatory Visit: Payer: Self-pay

## 2018-12-03 DIAGNOSIS — R928 Other abnormal and inconclusive findings on diagnostic imaging of breast: Secondary | ICD-10-CM | POA: Diagnosis present

## 2018-12-03 DIAGNOSIS — R921 Mammographic calcification found on diagnostic imaging of breast: Secondary | ICD-10-CM | POA: Diagnosis present

## 2018-12-04 ENCOUNTER — Other Ambulatory Visit: Payer: Self-pay | Admitting: Family Medicine

## 2018-12-04 DIAGNOSIS — R921 Mammographic calcification found on diagnostic imaging of breast: Secondary | ICD-10-CM

## 2018-12-04 DIAGNOSIS — R928 Other abnormal and inconclusive findings on diagnostic imaging of breast: Secondary | ICD-10-CM

## 2018-12-11 ENCOUNTER — Ambulatory Visit
Admission: RE | Admit: 2018-12-11 | Discharge: 2018-12-11 | Disposition: A | Discharge status: Home / Self Care | Payer: 59 | Source: Ambulatory Visit | Attending: Family Medicine | Admitting: Family Medicine

## 2018-12-11 DIAGNOSIS — R921 Mammographic calcification found on diagnostic imaging of breast: Secondary | ICD-10-CM

## 2018-12-11 DIAGNOSIS — R928 Other abnormal and inconclusive findings on diagnostic imaging of breast: Secondary | ICD-10-CM

## 2018-12-11 HISTORY — PX: BREAST BIOPSY: SHX20

## 2018-12-15 LAB — SURGICAL PATHOLOGY

## 2018-12-31 ENCOUNTER — Encounter: Payer: Self-pay | Admitting: *Deleted

## 2019-03-12 ENCOUNTER — Telehealth (INDEPENDENT_AMBULATORY_CARE_PROVIDER_SITE_OTHER): Payer: 59 | Admitting: Physician Assistant

## 2019-03-12 ENCOUNTER — Other Ambulatory Visit: Payer: Self-pay

## 2019-03-12 VITALS — Temp 99.8°F

## 2019-03-12 DIAGNOSIS — R05 Cough: Secondary | ICD-10-CM

## 2019-03-12 DIAGNOSIS — R059 Cough, unspecified: Secondary | ICD-10-CM

## 2019-03-12 DIAGNOSIS — Z20822 Contact with and (suspected) exposure to covid-19: Secondary | ICD-10-CM

## 2019-03-12 NOTE — Progress Notes (Signed)
Patient: Rose Sandoval Female    DOB: 21-Jun-1960   58 y.o.   MRN: [id number] Visit Date: 03/12/2019  Today's Provider: Trey Sailors, PA-C   Chief Complaint  Patient presents with  . Fever   Subjective:    Virtual Visit via Video Note  I connected with Rose Sandoval on 03/12/19 at  1:20 PM EDT by a video enabled telemedicine application and verified that I am speaking with the correct person using two identifiers.  Location: Patient: Home Provider: Office    HPI   Patient reports fever and sore throat since Tuesday afternoon. Patient reports fever of 100.4 and reports myalgias. 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 She reports there have been a few other cases but she is not she what her level of contact was. Denies Sob, denies coughing.     No Known Allergies   Current Outpatient Medications:  .  Ascorbic Acid (VITA-C PO), Take by mouth., Disp: , Rfl:  .  Multiple Vitamins-Minerals (ZINC PO), Take by mouth., Disp: , Rfl:  .  OIL OF OREGANO PO, Take by mouth., Disp: , Rfl:  .  Progesterone Micronized 10 % CREA, Place 1 application onto the skin daily., Disp: , Rfl:  .  VITAMIN D PO, Take by mouth., Disp: , Rfl:  .  Efinaconazole 10 % SOLN, Apply topically to affected toenails daily for 48 weeks (Patient not taking: Reported on 03/12/2019), Disp: 8 mL, Rfl: 5  Review of Systems  Constitutional: Negative for appetite change, chills, fatigue and fever.  Respiratory: Negative for chest tightness and shortness of breath.   Cardiovascular: Negative for chest pain and palpitations.  Gastrointestinal: Negative for abdominal pain, nausea and vomiting.  Neurological: Negative for dizziness and weakness.    Social History   Tobacco Use  . Smoking status: Never Smoker  .  Smokeless tobacco: Never Used  Substance Use Topics  . Alcohol use: Never    Frequency: Never      Objective:   Temp 99.8 F (37.7 C) (Oral)  Vitals:   03/12/19 1133  Temp: 99.8 F (37.7 C)  TempSrc: Oral  There is no height or weight on file to calculate BMI.   Physical Exam Constitutional:      General: She is not in acute distress.    Appearance: Normal appearance. She is not ill-appearing.  Pulmonary:     Effort: Pulmonary effort is normal. No respiratory distress.  Skin:    General: Skin is warm and dry.  Neurological:     Mental Status: She is alert and oriented to person, place, and time. Mental status is at baseline.  Psychiatric:        Mood and Affect: Mood normal.        Behavior: Behavior normal.      No results found for any visits on 03/12/19.     Assessment & Plan    1. Cough  Counseled on testing procedures, quarantine protocol, sx treatment. Work note provided.   - Novel Coronavirus, NAA ([facility])  I discussed the assessment and treatment plan with the patient. The patient was provided an opportunity to ask questions and all were answered. The patient agreed with the plan and demonstrated an understanding of the instructions.  The patient was advised to call back or seek an in-person evaluation if the symptoms worsen or if the condition fails to improve as anticipated.  I provided 15 minutes of non-face-to-face time during this encounter.  The entirety of the information documented in the History of Present Illness, Review of Systems and Physical Exam were personally obtained by me. Portions of this information were initially documented by April M. Sabra Heck, CMA and reviewed by me for thoroughness and accuracy.      Trinna Post, PA-C  [facility]

## 2019-03-13 LAB — NOVEL CORONAVIRUS, NAA: SARS-CoV-2, NAA: DETECTED — AB

## 2019-11-11 ENCOUNTER — Other Ambulatory Visit: Payer: Self-pay

## 2019-11-11 ENCOUNTER — Ambulatory Visit (INDEPENDENT_AMBULATORY_CARE_PROVIDER_SITE_OTHER): Payer: 59 | Admitting: Family Medicine

## 2019-11-11 ENCOUNTER — Encounter: Payer: Self-pay | Admitting: Family Medicine

## 2019-11-11 VITALS — BP 79/41 | HR 60 | Temp 97.1°F | Resp 16 | Ht 61.0 in | Wt 111.0 lb

## 2019-11-11 DIAGNOSIS — Z23 Encounter for immunization: Secondary | ICD-10-CM | POA: Diagnosis not present

## 2019-11-11 DIAGNOSIS — Z114 Encounter for screening for human immunodeficiency virus [HIV]: Secondary | ICD-10-CM

## 2019-11-11 DIAGNOSIS — I959 Hypotension, unspecified: Secondary | ICD-10-CM

## 2019-11-11 DIAGNOSIS — Z1211 Encounter for screening for malignant neoplasm of colon: Secondary | ICD-10-CM

## 2019-11-11 DIAGNOSIS — Z1159 Encounter for screening for other viral diseases: Secondary | ICD-10-CM

## 2019-11-11 DIAGNOSIS — Z5320 Procedure and treatment not carried out because of patient's decision for unspecified reasons: Secondary | ICD-10-CM

## 2019-11-11 DIAGNOSIS — Z0000 Encounter for general adult medical examination without abnormal findings: Secondary | ICD-10-CM | POA: Diagnosis not present

## 2019-11-11 HISTORY — DX: Hypotension, unspecified: I95.9

## 2019-11-11 NOTE — Progress Notes (Signed)
Complete physical exam   Patient: Rose Sandoval   DOB: 1960-12-30   59 y.o. Female  MRN: [id number] Visit Date: 11/11/2019  I,Sulibeya S Dimas,acting as a scribe for Shirlee Latch, MD.,have documented all relevant documentation on the behalf of Shirlee Latch, MD,as directed by  Shirlee Latch, MD while in the presence of Shirlee Latch, MD.  Today's healthcare provider: Shirlee Latch, MD   Chief Complaint  Patient presents with   Annual Exam   Subjective    Rose Sandoval is a 59 y.o. female who presents today for a complete physical exam.  She reports consuming a general diet. The patient does not participate in regular exercise at present. She generally feels fairly well. She reports sleeping fairly well. She does not have additional problems to discuss today.  HPI  11/10/2018 Pap/HPV-negative 12/11/2018 Mammogram-pathology-negative for in situ and invasive carcinoma - needs repeat diagnostic mammogram - patient declines and would rather no get a mammogram unless she has a palpable lump 11/21/2018 BMD-Osteopenia   Past Medical History:  Diagnosis Date   Allergy    Arthritis    Cataract    2018   Past Surgical History:  Procedure Laterality Date   BREAST BIOPSY Right 12/11/2018   right breast calcs #1 UIQ , coil clip, path pending   BREAST BIOPSY Right 12/11/2018   right breast calcs #2 UOQ, x clip, path pending   CATARACT EXTRACTION, BILATERAL     with lens replacement   Social History   Socioeconomic History   Marital status: Married    Spouse name: Not on file   Number of children: Not on file   Years of education: Not on file   Highest education level: Not on file  Occupational History   Occupation: Runner, broadcasting/film/video (middle school)  Tobacco Use   Smoking status: Never Smoker   Smokeless tobacco: Never Used  Substance and Sexual Activity   Alcohol use: Never   Drug use: Never   Sexual activity: Not on file  Other Topics  Concern   Not on file  Social History Narrative   Not on file   Social Determinants of Health   Financial Resource Strain:    Difficulty of Paying Living Expenses:   Food Insecurity:    Worried About Programme researcher, broadcasting/film/video in the Last Year:    Barista in the Last Year:   Transportation Needs:    Freight forwarder (Medical):    Lack of Transportation (Non-Medical):   Physical Activity:    Days of Exercise per Week:    Minutes of Exercise per Session:   Stress:    Feeling of Stress :   Social Connections:    Frequency of Communication with Friends and Family:    Frequency of Social Gatherings with Friends and Family:    Attends Religious Services:    Active Member of Clubs or Organizations:    Attends Engineer, structural:    Marital Status:   Intimate Partner Violence:    Fear of Current or Ex-Partner:    Emotionally Abused:    Physically Abused:    Sexually Abused:    Family Status  Relation Name Status   Mother  Alive   Father  Alive   Oneal Grout  (Not Specified)   MGM  (Not Specified)   PGF  (Not Specified)   Brother  Deceased at age 53 yrs old   MGF  (Not Specified)   PGM  (Not  Specified)   Sister  Alive   Daughter  Alive   Son  Alive   Daughter  Alive   Son  Alive   Family History  Problem Relation Age of Onset   Breast cancer Mother    Osteoporosis Mother    Dementia Father    Prostate cancer Father    Heart attack Paternal Uncle    Cancer Maternal Grandmother        ?skin cancer, unsure of type   Heart attack Paternal Grandfather    Leukemia Brother    Emphysema Maternal Grandfather    Dementia Paternal Grandmother    No Known Allergies  Patient Care Team: Erasmo Downer, MD as PCP - General (Family Medicine)   Medications: Outpatient Medications Prior to Visit  Medication Sig   Ascorbic Acid (VITA-C PO) Take by mouth.   Calcium Citrate POWD by Does not apply route.    Collagenase POWD by Does not apply route.   Magnesium Oxide POWD Take by mouth.   Multiple Vitamins-Minerals (ZINC PO) Take by mouth.   OIL OF OREGANO PO Take by mouth.   Progesterone Micronized 10 % CREA Place 1 application onto the skin daily.   pyridOXINE (VITAMIN B-6) 25 MG tablet Take by mouth.   VITAMIN D PO Take by mouth.   [DISCONTINUED] Efinaconazole 10 % SOLN Apply topically to affected toenails daily for 48 weeks (Patient not taking: Reported on 11/11/2019)   No facility-administered medications prior to visit.    Review of Systems  Constitutional: Negative.   HENT: Negative.   Eyes: Negative.   Respiratory: Negative.   Cardiovascular: Negative.   Gastrointestinal: Negative.   Endocrine: Negative.   Genitourinary: Negative.   Musculoskeletal: Negative.   Skin: Negative.   Allergic/Immunologic: Negative.   Neurological: Negative.   Hematological: Negative.   Psychiatric/Behavioral: Negative.     Last CBC Lab Results  Component Value Date   WBC 5.1 05/16/2018   HGB 12.1 05/16/2018   HCT 38.5 05/16/2018   MCV 90 05/16/2018   MCH 28.1 05/16/2018   RDW 12.1 (L) 05/16/2018   PLT 279 05/16/2018   Last metabolic panel Lab Results  Component Value Date   GLUCOSE 90 05/16/2018   NA 139 05/16/2018   K 4.3 05/16/2018   CL 103 05/16/2018   CO2 22 05/16/2018   BUN 15 05/16/2018   CREATININE 0.73 05/16/2018   GFRNONAA 92 05/16/2018   GFRAA 106 05/16/2018   CALCIUM 9.4 05/16/2018   PROT 6.7 05/16/2018   ALBUMIN 4.2 05/16/2018   LABGLOB 2.5 05/16/2018   AGRATIO 1.7 05/16/2018   BILITOT 0.3 05/16/2018   ALKPHOS 59 05/16/2018   AST 16 05/16/2018   ALT 15 05/16/2018   Last lipids Lab Results  Component Value Date   CHOL 187 05/16/2018   HDL 69 05/16/2018   LDLCALC 100 (H) 05/16/2018   TRIG 91 05/16/2018   CHOLHDL 2.7 05/16/2018   Last thyroid functions Lab Results  Component Value Date   TSH 1.660 05/16/2018     Objective    BP (!) 79/41 (BP  Location: Left Arm, Patient Position: Sitting, Cuff Size: Normal)    Pulse 60    Temp (!) 97.1 F (36.2 C) (Temporal)    Resp 16    Ht 5\' 1"  (1.549 m)    Wt 111 lb (50.3 kg)    BMI 20.97 kg/m  BP Readings from Last 3 Encounters:  11/11/19 (!) 79/41  11/10/18 91/66  04/25/18 118/78   Wt  Readings from Last 3 Encounters:  11/11/19 111 lb (50.3 kg)  11/10/18 117 lb 6.4 oz (53.3 kg)  04/25/18 115 lb 12.8 oz (52.5 kg)      Physical Exam Vitals reviewed. Exam conducted with a chaperone present.  Constitutional:      General: She is not in acute distress.    Appearance: Normal appearance. She is well-developed. She is not diaphoretic.  HENT:     Head: Normocephalic and atraumatic.     Right Ear: Tympanic membrane, ear canal and external ear normal.     Left Ear: Tympanic membrane, ear canal and external ear normal.  Eyes:     General: No scleral icterus.    Conjunctiva/sclera: Conjunctivae normal.     Pupils: Pupils are equal, round, and reactive to light.  Neck:     Thyroid: No thyromegaly.  Cardiovascular:     Rate and Rhythm: Normal rate and regular rhythm.     Pulses: Normal pulses.     Heart sounds: Normal heart sounds. No murmur.  Pulmonary:     Effort: Pulmonary effort is normal. No respiratory distress.     Breath sounds: Normal breath sounds. No wheezing or rales.  Chest:     Breasts:        Right: Normal. No mass, nipple discharge, skin change or tenderness.        Left: Normal. No mass, nipple discharge, skin change or tenderness.  Abdominal:     General: There is no distension.     Palpations: Abdomen is soft.     Tenderness: There is no abdominal tenderness.  Musculoskeletal:        General: No deformity.     Cervical back: Neck supple.     Right lower leg: No edema.     Left lower leg: No edema.  Lymphadenopathy:     Cervical: No cervical adenopathy.     Upper Body:     Right upper body: No axillary adenopathy.     Left upper body: No axillary adenopathy.    Skin:    General: Skin is warm and dry.     Findings: No rash.  Neurological:     Mental Status: She is alert and oriented to person, place, and time. Mental status is at baseline.     Sensory: No sensory deficit.     Motor: No weakness.     Gait: Gait normal.     Deep Tendon Reflexes: Reflexes are normal and symmetric.  Psychiatric:        Mood and Affect: Mood normal.        Behavior: Behavior normal.        Thought Content: Thought content normal.      Depression Screen  PHQ 2/9 Scores 11/11/2019 11/10/2018 04/25/2018  PHQ - 2 Score 0 0 0  PHQ- 9 Score 2 1 0    No results found for any visits on 11/11/19.  Assessment & Plan    Routine Health Maintenance and Physical Exam  Exercise Activities and Dietary recommendations Goals   None     Immunization History  Administered Date(s) Administered   Tdap 11/11/2019    Health Maintenance  Topic Date Due   Hepatitis C Screening  Never done   HIV Screening  Never done   COLONOSCOPY  Never done   INFLUENZA VACCINE  01/03/2020   MAMMOGRAM  12/02/2020   PAP SMEAR-Modifier  11/09/2021   TETANUS/TDAP  11/10/2029    Discussed health benefits of physical activity, and  encouraged her to engage in regular exercise appropriate for her age and condition.  Problem List Items Addressed This Visit      Cardiovascular and Mediastinum   Hypotension    Patient has a history of low BP readings Patient denies any symptoms Advised to add some salt to her food Return precaution discussed      Relevant Orders   CBC with Differential/Platelet   Comprehensive metabolic panel   Lipid Panel With LDL/HDL Ratio   TSH     Other   Mammogram declined    Patient advised to consider mammogram in the fall Advised patient of importance of screening mammogram       Other Visit Diagnoses    Annual physical exam    -  Primary   Relevant Orders   CBC with Differential/Platelet   Comprehensive metabolic panel   Lipid Panel With  LDL/HDL Ratio   TSH   Need for Tdap vaccination       Relevant Orders   Tdap vaccine greater than or equal to 7yo IM (Completed)   Need for hepatitis C screening test       Relevant Orders   Hepatitis C antibody   Screen for colon cancer       Relevant Orders   Ambulatory referral to Gastroenterology   Screening for HIV (human immunodeficiency virus)       Relevant Orders   HIV Antibody (routine testing w rflx)       Return in about 1 year (around 11/10/2020) for CPE.     I, Lavon Paganini, MD, have reviewed all documentation for this visit. The documentation on 11/11/19 for the exam, diagnosis, procedures, and orders are all accurate and complete.   Crystian Frith, Dionne Bucy, MD, MPH [facility]

## 2019-11-11 NOTE — Assessment & Plan Note (Signed)
Patient has a history of low BP readings Patient denies any symptoms Advised to add some salt to her food Return precaution discussed

## 2019-11-11 NOTE — Assessment & Plan Note (Signed)
Patient advised to consider mammogram in the fall Advised patient of importance of screening mammogram

## 2019-11-11 NOTE — Patient Instructions (Signed)

## 2019-11-25 ENCOUNTER — Encounter: Payer: Self-pay | Admitting: *Deleted

## 2020-02-15 ENCOUNTER — Other Ambulatory Visit: Payer: Self-pay | Admitting: Family Medicine

## 2020-02-15 ENCOUNTER — Telehealth: Payer: Self-pay

## 2020-02-15 DIAGNOSIS — Z20822 Contact with and (suspected) exposure to covid-19: Secondary | ICD-10-CM

## 2020-02-15 NOTE — Telephone Encounter (Signed)
Order for Covid antibodies.   Thanks,   -Vernona Rieger

## 2020-02-16 ENCOUNTER — Telehealth: Payer: Self-pay

## 2020-02-16 LAB — CBC WITH DIFFERENTIAL/PLATELET
Basophils Absolute: 0 10*3/uL (ref 0.0–0.2)
Basos: 0 %
EOS (ABSOLUTE): 0.2 10*3/uL (ref 0.0–0.4)
Eos: 3 %
Hematocrit: 39 % (ref 34.0–46.6)
Hemoglobin: 12.8 g/dL (ref 11.1–15.9)
Immature Grans (Abs): 0 10*3/uL (ref 0.0–0.1)
Immature Granulocytes: 0 %
Lymphocytes Absolute: 1.9 10*3/uL (ref 0.7–3.1)
Lymphs: 36 %
MCH: 29.3 pg (ref 26.6–33.0)
MCHC: 32.8 g/dL (ref 31.5–35.7)
MCV: 89 fL (ref 79–97)
Monocytes Absolute: 0.5 10*3/uL (ref 0.1–0.9)
Monocytes: 9 %
Neutrophils Absolute: 2.7 10*3/uL (ref 1.4–7.0)
Neutrophils: 52 %
Platelets: 247 10*3/uL (ref 150–450)
RBC: 4.37 x10E6/uL (ref 3.77–5.28)
RDW: 12.1 % (ref 11.7–15.4)
WBC: 5.2 10*3/uL (ref 3.4–10.8)

## 2020-02-16 LAB — LIPID PANEL WITH LDL/HDL RATIO
Cholesterol, Total: 216 mg/dL — ABNORMAL HIGH (ref 100–199)
HDL: 71 mg/dL (ref >39)
LDL Chol Calc (NIH): 128 mg/dL — ABNORMAL HIGH (ref 0–99)
LDL/HDL Ratio: 1.8 ratio (ref 0.0–3.2)
Triglycerides: 98 mg/dL (ref 0–149)
VLDL Cholesterol Cal: 17 mg/dL (ref 5–40)

## 2020-02-16 LAB — HIV ANTIBODY (ROUTINE TESTING W REFLEX): HIV Screen 4th Generation wRfx: NONREACTIVE

## 2020-02-16 LAB — COMPREHENSIVE METABOLIC PANEL
ALT: 24 IU/L (ref 0–32)
AST: 19 IU/L (ref 0–40)
Albumin/Globulin Ratio: 1.5 (ref 1.2–2.2)
Albumin: 4.4 g/dL (ref 3.8–4.9)
Alkaline Phosphatase: 61 IU/L (ref 44–121)
BUN/Creatinine Ratio: 18 (ref 9–23)
BUN: 14 mg/dL (ref 6–24)
Bilirubin Total: 0.4 mg/dL (ref 0.0–1.2)
CO2: 21 mmol/L (ref 20–29)
Calcium: 9.4 mg/dL (ref 8.7–10.2)
Chloride: 103 mmol/L (ref 96–106)
Creatinine, Ser: 0.78 mg/dL (ref 0.57–1.00)
GFR calc Af Amer: 96 mL/min/{1.73_m2} (ref >59)
GFR calc non Af Amer: 83 mL/min/{1.73_m2} (ref >59)
Globulin, Total: 3 g/dL (ref 1.5–4.5)
Glucose: 94 mg/dL (ref 65–99)
Potassium: 4 mmol/L (ref 3.5–5.2)
Sodium: 139 mmol/L (ref 134–144)
Total Protein: 7.4 g/dL (ref 6.0–8.5)

## 2020-02-16 LAB — HEPATITIS C ANTIBODY: Hep C Virus Ab: <0.1 s/co ratio (ref 0.0–0.9)

## 2020-02-16 LAB — SARS-COV-2 ANTIBODY, IGM: SARS-CoV-2 Antibody, IgM: POSITIVE

## 2020-02-16 LAB — TSH: TSH: 2.82 u[IU]/mL (ref 0.450–4.500)

## 2020-02-16 NOTE — Telephone Encounter (Signed)
-----   Message from Erasmo Downer, MD sent at 02/16/2020  8:09 AM EDT ----- Normal labs, except increase in cholesterol from last year.  10 year risk of heart disease/stroke is low at 1.3% though.  No need for medications, but recommend diet low in saturated fat and regular exercise - 30 min at least 5 times per week.   ##CMAs:  Wrong COVID Ab test was processed.  Please call [facility] and switch to IgG.##

## 2020-02-16 NOTE — Telephone Encounter (Signed)
LMTCB, PEC may give result.  

## 2020-02-16 NOTE — Telephone Encounter (Signed)
Pt given lab results per notes of Dr. Beryle Flock on 02/16/20. Pt verbalized understanding.Patient requesting to know "What levels of antibodies are shown from recent covid results?". Please advise .

## 2020-02-17 LAB — SPECIMEN STATUS REPORT

## 2020-02-17 LAB — SAR COV2 SEROLOGY (COVID19)AB(IGG),IA: DiaSorin SARS-CoV-2 Ab, IgG: POSITIVE

## 2020-02-18 NOTE — Telephone Encounter (Signed)
IgM levels, which are early and not as long lasting antibodies, were checked instead of IgG.  These were positive, but we need IgG levels to know about longer lasting immunity and not just any possible recent illness.

## 2020-06-02 IMAGING — MG MM CLIP PLACEMENT
2 series · 2 of 2 positions shown · non-contrast
Comparison: Previous exam(s).

CLINICAL DATA: Post stereotactic core needle biopsy of the right
breast

EXAM:
DIAGNOSTIC RIGHT MAMMOGRAM POST STEREOTACTIC BIOPSY

[R CC]
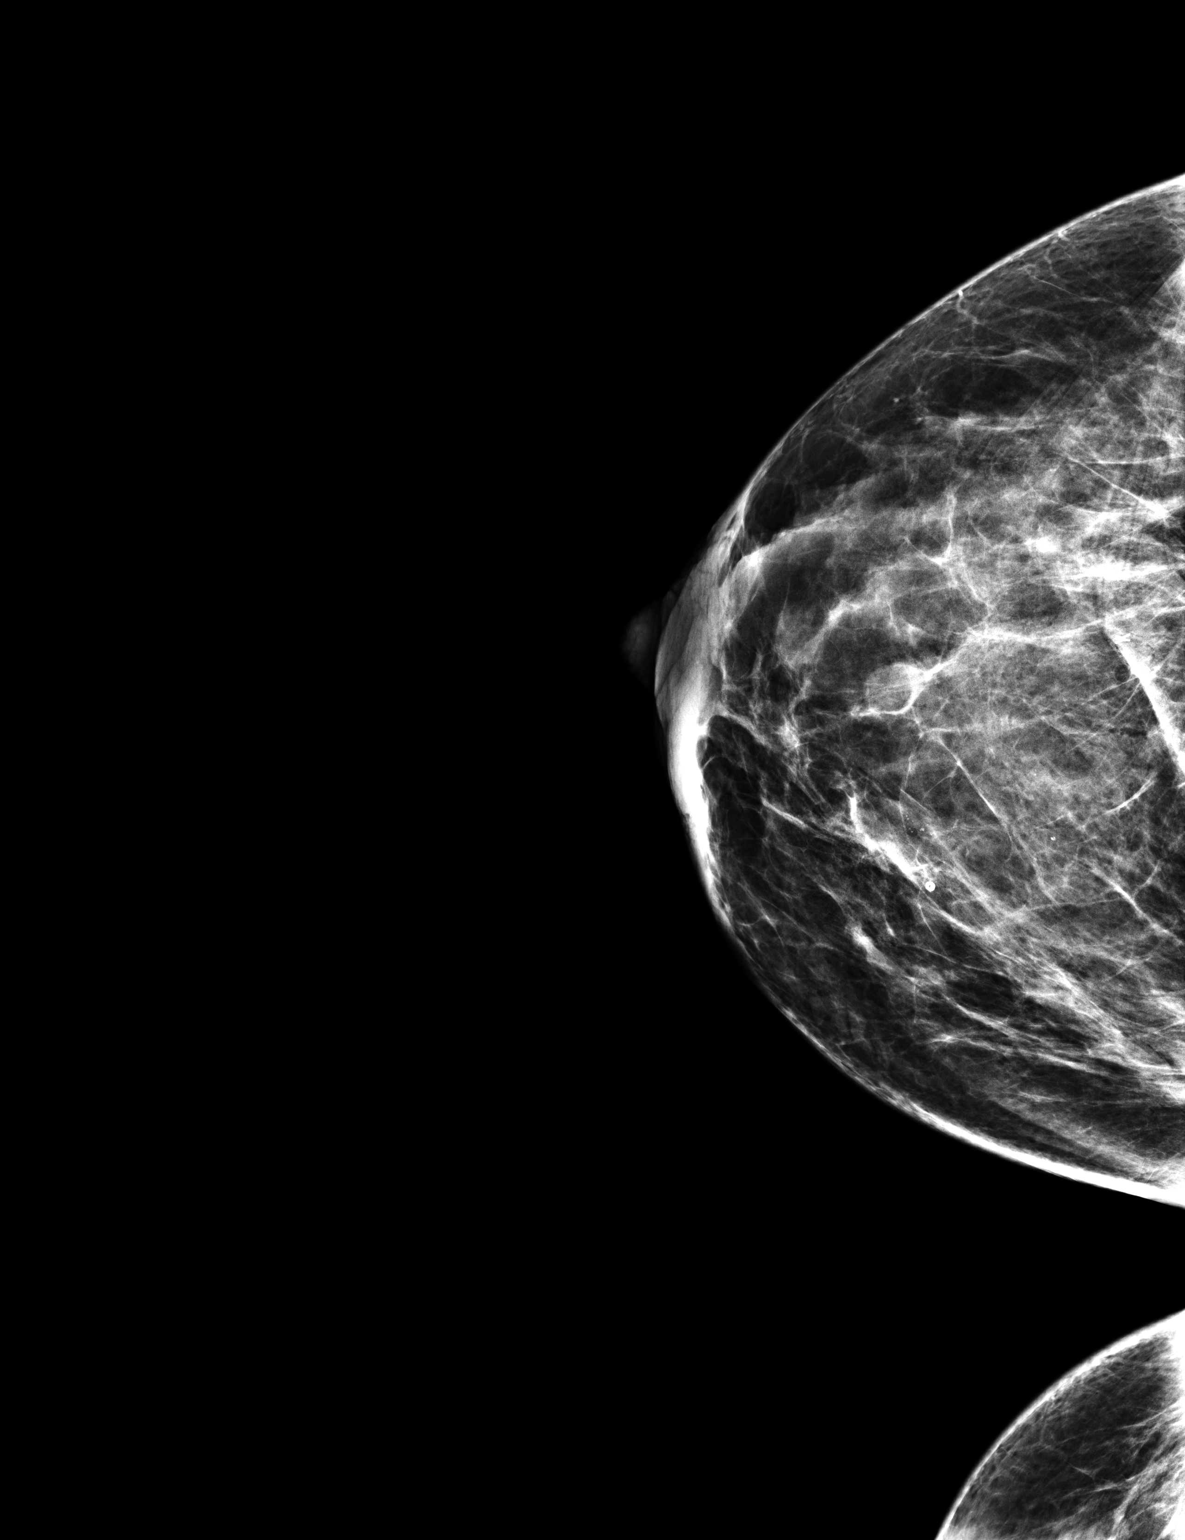

[R ML]
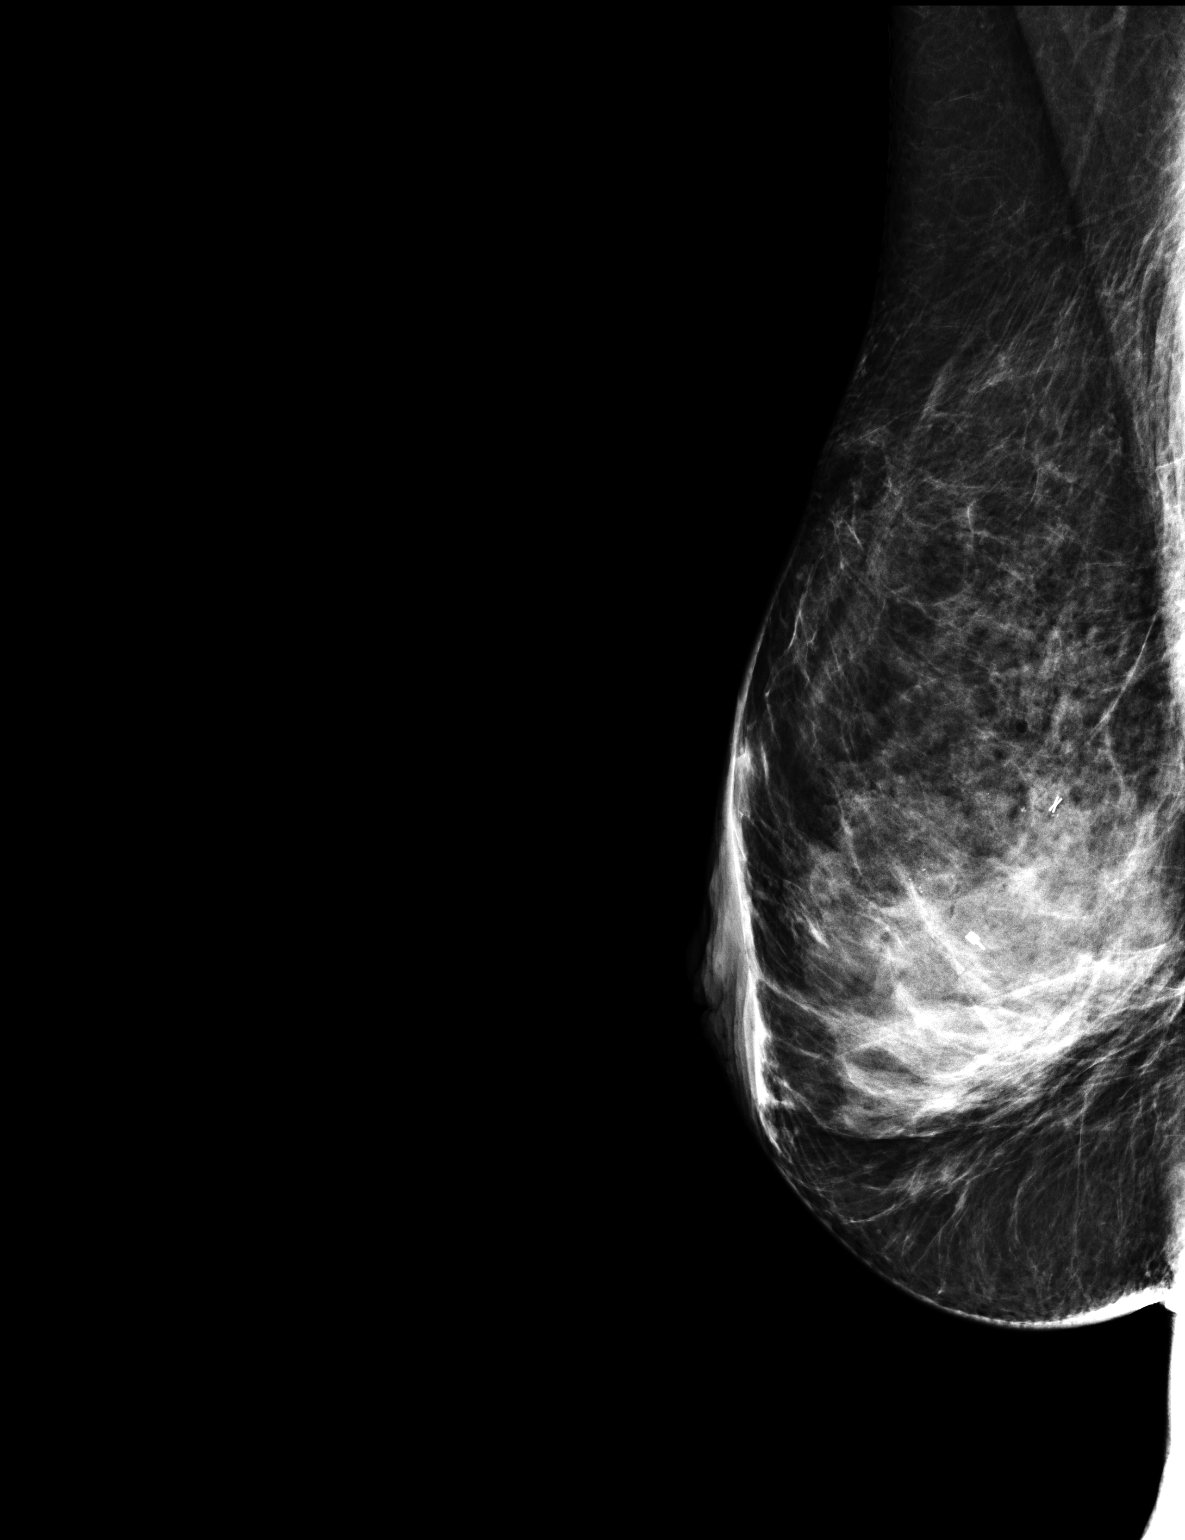

[2 of 2 positions shown; findings below may reference images not displayed]

FINDINGS: Mammographic images were obtained following stereotactic guided
biopsy of right breast. Coil shaped marker is associated with the
first biopsy site in the upper inner quadrant, an X shaped marker is
associated with the second biopsy site in the upper outer quadrant.
Both clips are appropriately position.
IMPRESSION: Post stereotactic core needle biopsy of the right breast:

1. Coil shaped marker upper inner quadrant
2. X shaped marker, upper outer quadrant.

Final Assessment: Post Procedure Mammograms for Marker Placement

## 2020-06-02 IMAGING — MG MM BREAST BX W/ LOC DEV EA AD LESION IMG BX SPEC STEREO GUIDE*R*
7 of 12 series · 7 of 16 positions shown · non-contrast
Comparison: Previous exams.
COMPARISON: Previous exams.

Addendum:
CLINICAL DATA: Three indeterminate group of calcifications in the
right breast.

EXAM:
RIGHT BREAST STEREOTACTIC CORE NEEDLE BIOPSY

[R (1 of 4)]
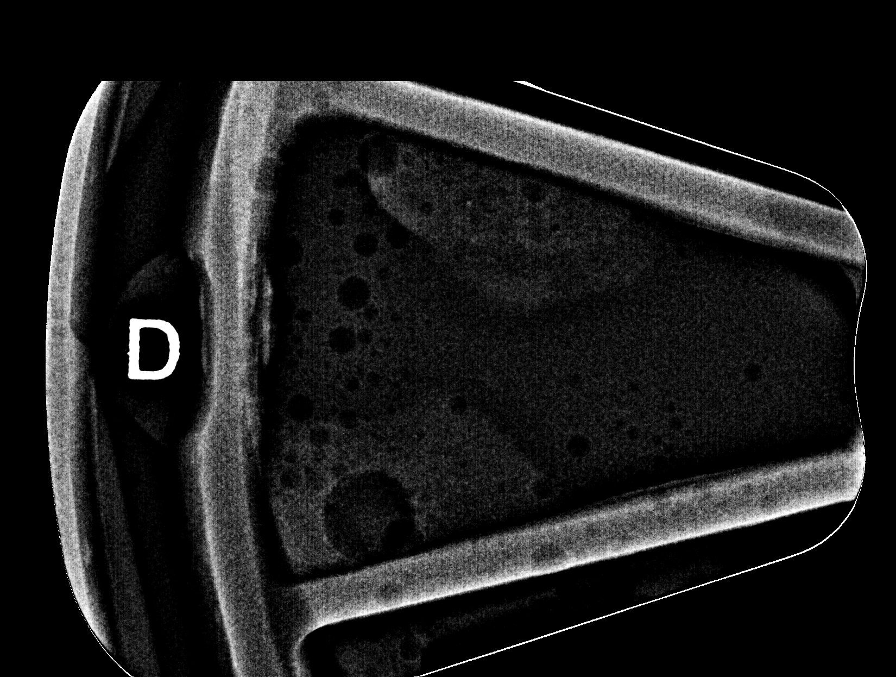

[R (2 of 4)]
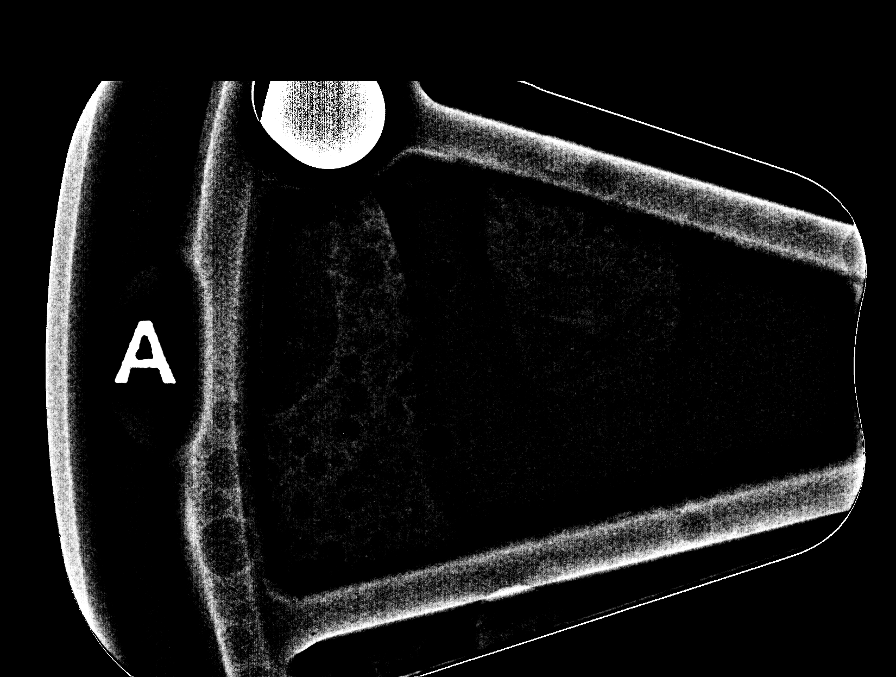

[R (3 of 4)]
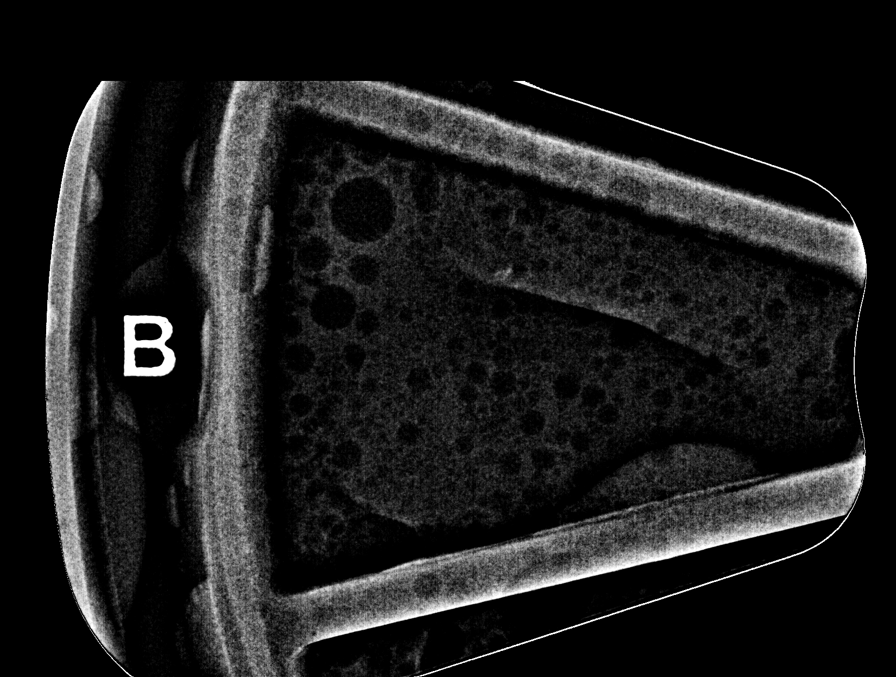

[R (4 of 4)]
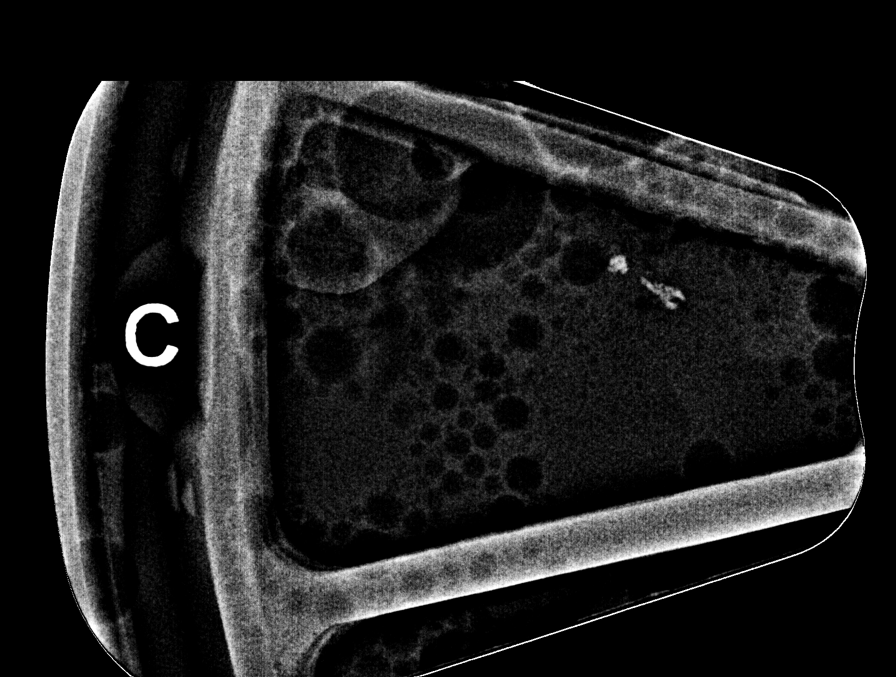

[R CC (1 of 3)]
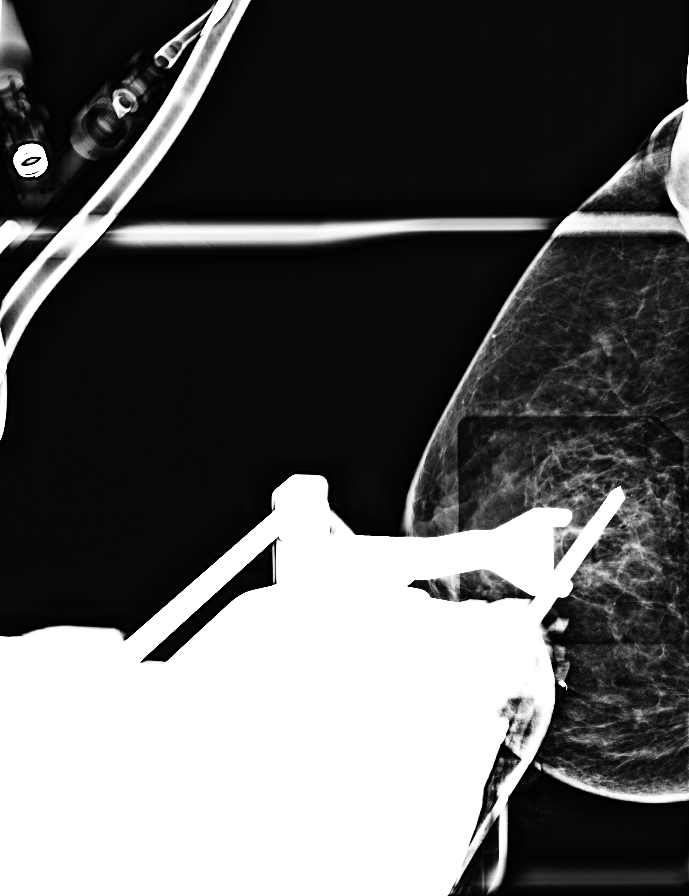

[R CC (2 of 3)]
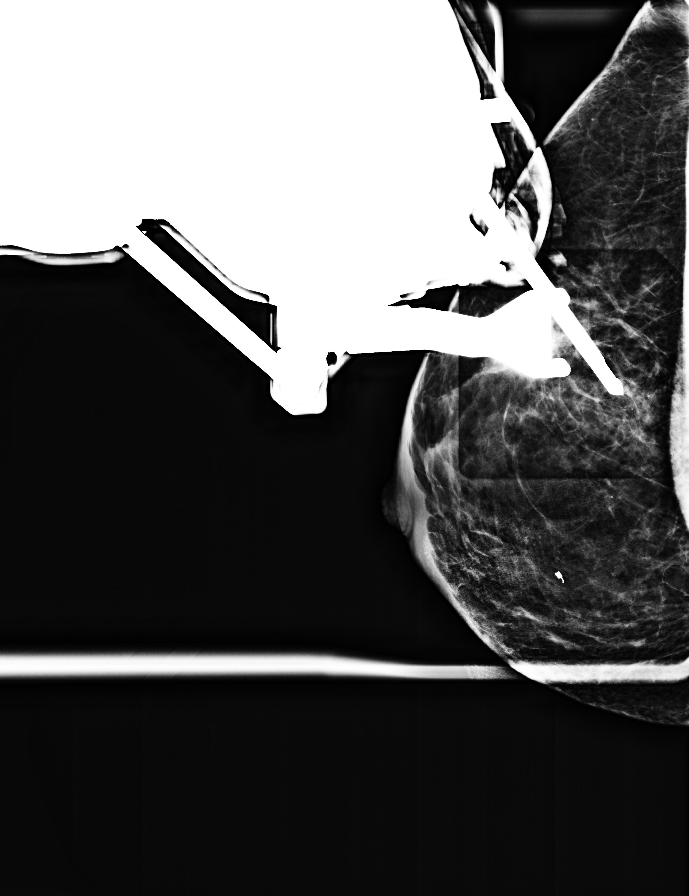

[R CC (3 of 3)]
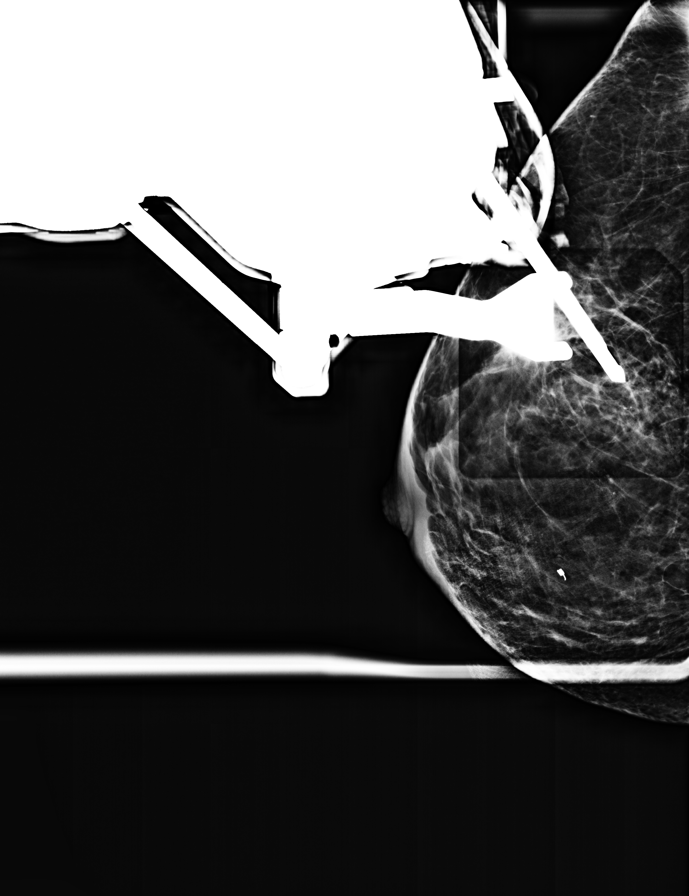

[7 of 16 positions shown; findings below may reference images not displayed]



Using sterile technique and 1% Lidocaine as local anesthetic, under
stereotactic guidance, a 9 gauge vacuum assisted device was used to
perform core needle biopsy of calcifications in the upper inner
quadrant of the right breast using a superior approach. Specimen
radiograph was performed showing presence of calcifications.
Specimens with calcifications are identified for pathology.

Lesion quadrant: Upper inner quadrant

At the conclusion of the procedure, a coil shaped tissue marker clip
was deployed into the biopsy cavity.

Next, using sterile technique and 1% Lidocaine as local anesthetic,
under stereotactic guidance, a 9 gauge vacuum assisted device was
used to perform core needle biopsy of calcifications in the
upper-outer quadrant of the right breast using a superior approach.
Specimen radiograph was performed showing presence of
calcifications. Specimens with calcifications are identified for
pathology.

Lesion quadrant: Upper outer quadrant

At the conclusion of the procedure, a X shaped tissue marker clip
was deployed into the biopsy cavity.

Follow-up 2-view mammogram was performed and dictated separately.
IMPRESSION: Stereotactic-guided biopsy of 2 groups of calcifications in the
right breast. No apparent complications.

The management of the third group of calcifications in the right
breast should be based on the pathology results. With benign
pathology results, short-term imaging follow-up is recommended.
Otherwise, a third stereotactic core needle biopsy should be
performed at a later time.

ADDENDUM:
Pathology revealed A. RIGHT BREAST UPPER INNER QUADRANT; BENIGN
BREAST TISSUE WITH FIBROCYSTIC CHANGES AND PAPILLARY APOCRINE
METAPLASIA. CALCIFICATIONS ARE NOTED IN ASSOCIATION WITH BENIGN
BREAST TISSUE. NEGATIVE FOR IN SITU AND INVASIVE CARCINOMA.

B. RIGHT BREAST, UPPER OUTER QUADRANT; BENIGN BREAST TISSUE WITH
FIBROCYSTIC CHANGES AND PAPILLARY APOCRINE METAPLASIA.
CALCIFICATIONS ARE NOTED IN ASSOCIATION WITH BENIGN BREAST TISSUE.
NEGATIVE FOR IN SITU AND INVASIVE CARCINOMA.

This was found to be concordant by Dr. Isthiyag Dx.

Pathology results were discussed with the patient by telephone. The
patient reported doing well after the biopsy with tenderness at the
site. Post biopsy instructions and care were reviewed and questions
were answered. The patient was encouraged to call The [HOSPITAL]

The patient was instructed to return for right diagnostic
mammography and possible ultrasound in 6 months.

Pathology results reported by Sugeeth Arif RN on 12/15/2018.



Using sterile technique and 1% Lidocaine as local anesthetic, under
stereotactic guidance, a 9 gauge vacuum assisted device was used to
perform core needle biopsy of calcifications in the upper inner
quadrant of the right breast using a superior approach. Specimen
radiograph was performed showing presence of calcifications.
Specimens with calcifications are identified for pathology.

Lesion quadrant: Upper inner quadrant

At the conclusion of the procedure, a coil shaped tissue marker clip
was deployed into the biopsy cavity.

Next, using sterile technique and 1% Lidocaine as local anesthetic,
under stereotactic guidance, a 9 gauge vacuum assisted device was
used to perform core needle biopsy of calcifications in the
upper-outer quadrant of the right breast using a superior approach.
Specimen radiograph was performed showing presence of
calcifications. Specimens with calcifications are identified for
pathology.

Lesion quadrant: Upper outer quadrant

At the conclusion of the procedure, a X shaped tissue marker clip
was deployed into the biopsy cavity.

Follow-up 2-view mammogram was performed and dictated separately.
IMPRESSION: Stereotactic-guided biopsy of 2 groups of calcifications in the
right breast. No apparent complications.

The management of the third group of calcifications in the right
breast should be based on the pathology results. With benign
pathology results, short-term imaging follow-up is recommended.
Otherwise, a third stereotactic core needle biopsy should be
performed at a later time.

## 2020-11-11 ENCOUNTER — Encounter: Payer: Self-pay | Admitting: Family Medicine

## 2021-01-16 ENCOUNTER — Ambulatory Visit (INDEPENDENT_AMBULATORY_CARE_PROVIDER_SITE_OTHER): Payer: 59 | Admitting: Family Medicine

## 2021-01-16 ENCOUNTER — Encounter: Payer: Self-pay | Admitting: Family Medicine

## 2021-01-16 ENCOUNTER — Other Ambulatory Visit: Payer: Self-pay

## 2021-01-16 VITALS — BP 100/72 | HR 82 | Temp 98.2°F | Ht 62.0 in | Wt 113.8 lb

## 2021-01-16 DIAGNOSIS — F4541 Pain disorder exclusively related to psychological factors: Secondary | ICD-10-CM | POA: Diagnosis not present

## 2021-01-16 DIAGNOSIS — R1084 Generalized abdominal pain: Secondary | ICD-10-CM

## 2021-01-16 DIAGNOSIS — Z5320 Procedure and treatment not carried out because of patient's decision for unspecified reasons: Secondary | ICD-10-CM

## 2021-01-16 DIAGNOSIS — M8589 Other specified disorders of bone density and structure, multiple sites: Secondary | ICD-10-CM

## 2021-01-16 DIAGNOSIS — M67432 Ganglion, left wrist: Secondary | ICD-10-CM

## 2021-01-16 DIAGNOSIS — Z1211 Encounter for screening for malignant neoplasm of colon: Secondary | ICD-10-CM

## 2021-01-16 DIAGNOSIS — Z0000 Encounter for general adult medical examination without abnormal findings: Secondary | ICD-10-CM

## 2021-01-16 DIAGNOSIS — R109 Unspecified abdominal pain: Secondary | ICD-10-CM | POA: Insufficient documentation

## 2021-01-16 NOTE — Assessment & Plan Note (Signed)
-   FHx of breast cancer (pt.'s mom), which she attributes to her mom using HRT after menopause - Pt. declines mammo at this time - Discussed benefits of mammograms, as well as potential alternatives - Counseled pt. on performing regular self breast exams

## 2021-01-16 NOTE — Patient Instructions (Signed)
   The CDC recommends two doses of Shingrix (the shingles vaccine) separated by 2 to 6 months for adults age 60 years and older. I recommend checking with your insurance plan regarding coverage for this vaccine.   

## 2021-01-16 NOTE — Assessment & Plan Note (Signed)
-   Chronic and stable - May consider referral for aspiration or surgery if it becomes bothersome/impedes function; pt. declines referral at this time - Will continue to monitor

## 2021-01-16 NOTE — Assessment & Plan Note (Addendum)
-   Acute, uncomplicated problem - Pt. attributes symptoms to recent stress, denies n/v/d & changes in bowel habits - Pt. declines management at this time - Will continue to monitor

## 2021-01-16 NOTE — Progress Notes (Signed)
Annual Wellness Visit     Patient: Rose Sandoval, Female    DOB: [birth year], 60 y.o.   MRN: [id number] Visit Date: 01/16/2021  Today's Provider: Shirlee Latch, MD   Chief Complaint  Patient presents with   Annual Exam   Subjective    [name] is a 60 y.o. female who presents today for her Annual Wellness Visit. She reports consuming a general diet. Exercise is limited by current life stressors. She generally feels well. She reports sleeping well. She does have additional problems to discuss today.   HPI Intermittent Abdominal Pain - Pt. describes diffuse, intermittent, cramping abdominal pain approx 1x/month - Pt. attributes pain to recent stressors (currently planning daughter's wedding) - Denies fever, n/v/d, changes in bowel habits, vaginal bleeding, hematochezia  Chronic Neck Pain - Pt. 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 attributes headaches to recent stress - Uses Tylenol occasionally, feels it's effective for pain control  Health Maintenance - Due for colonoscopy, mammo, & shingles vaccine  Medications: Outpatient Medications Prior to Visit  Medication Sig   Ascorbic Acid (VITA-C PO) Take by mouth.   Collagenase POWD by Does not apply route.   Magnesium Oxide POWD Take by mouth.   Multiple Vitamins-Minerals (ALGAE BASED CALCIUM PO) Take by mouth daily. 2 capsules   Multiple Vitamins-Minerals (ZINC PO) Take by mouth.   Progesterone Micronized 10 % CREA Place 1 application onto the skin daily.   pyridOXINE (VITAMIN B-6) 25 MG tablet Take by mouth.   VITAMIN D PO Take by mouth.   [DISCONTINUED] Calcium Citrate POWD by Does not apply route. (Patient not taking: Reported on 01/16/2021)   [DISCONTINUED] OIL OF  OREGANO PO Take by mouth. (Patient not taking: Reported on 01/16/2021)   No facility-administered medications prior to visit.    No Known Allergies  Patient Care Team: Erasmo Downer, MD as PCP - General (Family Medicine)  Review of Systems  Constitutional:  Negative for activity change, chills, fatigue, fever and unexpected weight change.  HENT: Negative.    Respiratory: Negative.  Negative for chest tightness and shortness of breath.   Cardiovascular: Negative.  Negative for chest pain and leg swelling.  Gastrointestinal:  Positive for abdominal pain. Negative for abdominal distention, blood in stool, constipation, diarrhea, nausea, rectal pain and vomiting.  Endocrine: Negative.   Genitourinary: Negative.  Negative for menstrual problem and vaginal bleeding.  Musculoskeletal:  Positive for back pain and neck pain.  Skin: Negative.   Allergic/Immunologic: Positive for environmental allergies.  Neurological:  Positive for headaches. Negative for weakness and numbness.  Hematological: Negative.   Psychiatric/Behavioral: Negative.          Objective    Vitals: BP 100/72 (BP Location: Right Arm, Patient Position: Sitting, Cuff Size: Normal)   Pulse 82   Temp 98.2 F (36.8 C) (Oral)   Ht 5\' 2"  (1.575 m)   Wt 113 lb 12.8 oz (51.6 kg)   SpO2 99%   BMI 20.81 kg/m     Physical Exam Constitutional:      General: She is not in acute distress.    Appearance: Normal appearance. She is normal weight.  HENT:     Head: Normocephalic and atraumatic.  Right Ear: External ear normal.     Left Ear: External ear normal.  Eyes:     Conjunctiva/sclera: Conjunctivae normal.  Cardiovascular:     Rate and Rhythm: Normal rate and regular rhythm.     Pulses: Normal pulses.     Heart sounds: Normal heart sounds.  Pulmonary:     Effort: Pulmonary effort is normal.     Breath sounds: Normal breath sounds.  Abdominal:     General: Abdomen is flat. Bowel sounds are normal. There is  no distension.     Palpations: Abdomen is soft.     Tenderness: There is no abdominal tenderness. There is no guarding.  Musculoskeletal:     Cervical back: No rigidity.     Right lower leg: No edema.     Left lower leg: No edema.     Comments: ~2 mm ganglion cyst in dorsum of L wrist  Skin:    General: Skin is warm and dry.  Neurological:     Mental Status: She is alert.  Psychiatric:        Behavior: Behavior normal.        Thought Content: Thought content normal.    Most recent functional status assessment: In your present state of health, do you have any difficulty performing the following activities: 01/16/2021  Hearing? N  Vision? N  Difficulty concentrating or making decisions? N  Walking or climbing stairs? N  Dressing or bathing? N  Doing errands, shopping? N  Some recent data might be hidden   Most recent fall risk assessment: Fall Risk  01/16/2021  Falls in the past year? 0  Number falls in past yr: 0  Injury with Fall? 1  Risk for fall due to : No Fall Risks  Follow up -    Most recent depression screenings: PHQ 2/9 Scores 01/16/2021 11/11/2019  PHQ - 2 Score 0 0  PHQ- 9 Score 0 2   Most recent cognitive screening: No flowsheet data found. Most recent Audit-C alcohol use screening Alcohol Use Disorder Test (AUDIT) 01/16/2021  1. How often do you have a drink containing alcohol? 0  2. How many drinks containing alcohol do you have on a typical day when you are drinking? 0  3. How often do you have six or more drinks on one occasion? 0  AUDIT-C Score 0  Alcohol Brief Interventions/Follow-up -   A score of 3 or more in women, and 4 or more in men indicates increased risk for alcohol abuse, EXCEPT if all of the points are from question 1   No results found for any visits on 01/16/21.  Assessment & Plan     Annual wellness visit done today including the all of the following: Reviewed patient's Family Medical History Reviewed and updated list of patient's  medical providers Assessment of cognitive impairment was done Assessed patient's functional ability Established a written schedule for health screening services Health Risk Assessent Completed and Reviewed  Exercise Activities and Dietary recommendations  Goals   None     Immunization History  Administered Date(s) Administered   Tdap 11/11/2019    Health Maintenance  Topic Date Due   COVID-19 Vaccine (1) Never done   COLONOSCOPY (Pts 45-68yrs Insurance coverage will need to be confirmed)  Never done   Zoster Vaccines- Shingrix (1 of 2) Never done   MAMMOGRAM  12/02/2020   INFLUENZA VACCINE  01/02/2021   PAP SMEAR-Modifier  11/09/2021   DEXA SCAN  11/21/2023   TETANUS/TDAP  11/10/2029   Hepatitis C Screening  Completed   HIV Screening  Completed   Pneumococcal Vaccine 86-60 Years old  Aged Out   HPV VACCINES  Aged Out     Discussed health benefits of physical activity, and encouraged her to engage in regular exercise appropriate for her age and condition.    Problem List Items Addressed This Visit       Other   Mammogram declined    - FHx of breast cancer (pt.'s mom), which she attributes to her mom using HRT after menopause - Pt. declines mammo at this time - Discussed benefits of mammograms, as well as potential alternatives - Counseled pt. on performing regular self breast exams      Colonoscopy refused    - Pt. has never had a colonoscopy - expresses hesitation in having one - Pt. reports that she had a negative FOBT in 2017, but no screenings since then - Pt. 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 declines referral at this time - Will continue to monitor      Stress headaches    - Chronic, recently exacerbated by stress of planning daughter's wedding -  Well-controlled with Tylenol; pt. declines further management at this time      Abdominal pain    - Acute, uncomplicated problem - Pt. attributes symptoms to recent stress, denies n/v/d & changes in bowel habits - Pt. declines management at this time - Will continue to monitor       Other Visit Diagnoses     Encounter for annual physical exam    -  Primary     Doing well, follow-up for annual physical in 1 year   Relevant Orders   Comprehensive metabolic panel   Lipid panel   Colon cancer screening       Relevant Orders   Cologuard   Osteopenia of multiple sites       - Chronic and stable - Counseled pt. to take 600 mg calcium supplement daily and to continue eating dairy products & high-calcium foods - Will continue to monitor        Return in about 1 year (around 01/16/2022) for CPE.      Queen Blossom, MS3   Patient seen along with MS3 student Queen Blossom. I personally evaluated this patient along with the student, and verified all aspects of the history, physical exam, and medical decision making as documented by the student. I agree with the student's documentation and have made all necessary edits.  Ovid Witman, Marzella Schlein, MD, MPH [facility]

## 2021-01-16 NOTE — Assessment & Plan Note (Signed)
-   Chronic, recently exacerbated by stress of planning daughter's wedding - Well-controlled with Tylenol; pt. declines further management at this time

## 2021-01-16 NOTE — Assessment & Plan Note (Signed)
-   Pt. has never had a colonoscopy - expresses hesitation in having one - Pt. reports that she had a negative FOBT in 2017, but no screenings since then - Pt. is amenable to having Cologuard screening - No FHx of colon cancer

## 2021-03-23 ENCOUNTER — Other Ambulatory Visit: Payer: Self-pay

## 2021-03-23 DIAGNOSIS — R195 Other fecal abnormalities: Secondary | ICD-10-CM

## 2021-03-23 DIAGNOSIS — Z1211 Encounter for screening for malignant neoplasm of colon: Secondary | ICD-10-CM

## 2021-03-23 LAB — COLOGUARD: Cologuard: POSITIVE — AB

## 2021-03-29 ENCOUNTER — Telehealth: Payer: Self-pay

## 2021-03-29 ENCOUNTER — Other Ambulatory Visit: Payer: Self-pay

## 2021-03-29 DIAGNOSIS — Z1211 Encounter for screening for malignant neoplasm of colon: Secondary | ICD-10-CM

## 2021-03-29 DIAGNOSIS — R195 Other fecal abnormalities: Secondary | ICD-10-CM

## 2021-03-29 MED ORDER — NA SULFATE-K SULFATE-MG SULF 17.5-3.13-1.6 GM/177ML PO SOLN: 1.0000 | Freq: Once | ORAL | 0 refills | Status: AC

## 2021-03-29 NOTE — Telephone Encounter (Signed)
Pt. Calling to schedule a colonoscopy 

## 2021-03-29 NOTE — Progress Notes (Signed)
Gastroenterology Pre-Procedure Review  Request Date: 05/01/21 Requesting Physician: Dr. Allegra Lai  PATIENT REVIEW QUESTIONS: The patient responded to the following health history questions as indicated:    1. Are you having any GI issues? no 2. Do you have a personal history of Polyps? no 3. Do you have a family history of Colon Cancer or Polyps? no 4. Diabetes Mellitus? no 5. Joint replacements in the past 12 months?no 6. Major health problems in the past 3 months?no 7. Any artificial heart valves, MVP, or defibrillator?no    MEDICATIONS & ALLERGIES:    Patient reports the following regarding taking any anticoagulation/antiplatelet therapy:   Plavix, Coumadin, Eliquis, Xarelto, Lovenox, Pradaxa, Brilinta, or Effient? no Aspirin? no  Patient confirms/reports the following medications:  Current Outpatient Medications  Medication Sig Dispense Refill   Ascorbic Acid (VITA-C PO) Take by mouth.     Collagenase POWD by Does not apply route.     Magnesium Oxide POWD Take by mouth.     Multiple Vitamins-Minerals (ALGAE BASED CALCIUM PO) Take by mouth daily. 2 capsules     Multiple Vitamins-Minerals (ZINC PO) Take by mouth.     Progesterone Micronized 10 % CREA Place 1 application onto the skin daily.     pyridOXINE (VITAMIN B-6) 25 MG tablet Take by mouth.     VITAMIN D PO Take by mouth.     No current facility-administered medications for this visit.    Patient confirms/reports the following allergies:  No Known Allergies  No orders of the defined types were placed in this encounter.   AUTHORIZATION INFORMATION Primary Insurance: 1D#: Group #:  Secondary Insurance: 1D#: Group #:  SCHEDULE INFORMATION: Date: 05/01/21  Time: Location: [facility]

## 2021-03-29 NOTE — Telephone Encounter (Signed)
Referral is listed in the [facility].

## 2021-04-26 ENCOUNTER — Telehealth: Payer: Self-pay | Admitting: Family Medicine

## 2021-04-26 NOTE — Telephone Encounter (Signed)
Copied from CRM [id number]. Topic: General - Inquiry >> Apr 26, 2021 12:46 PM [name] wrote: Reason for CRM: patient states she did have a cologuardand and was  advised by her PCP to have a colonoscopy which will cost her 3,600. Patient was advised the colonoscopy orders should reflect preventive instead of diagnostic. Patient is scheduled for colonoscopy at [facility] at the [facility] office on Monday and would like new orders reflecting preventive instead of diagnostic. Patient would like a follow up call as soon as  possible to determine if she has to cancel. Rose Sandoval

## 2021-04-26 NOTE — Telephone Encounter (Signed)
Patient says she was supposed to be having just a routine colonoscopy but says  was told it would be diagnostic and is being billed $3000. She is asking for Dr Beryle Flock to help in this , since it was supposed to be routine. Please call back

## 2021-05-01 ENCOUNTER — Ambulatory Visit: Admission: RE | Admit: 2021-05-01 | Payer: 59 | Source: Home / Self Care | Admitting: Gastroenterology

## 2021-05-01 ENCOUNTER — Encounter: Admission: RE | Payer: Self-pay | Source: Home / Self Care

## 2021-05-01 SURGERY — COLONOSCOPY WITH PROPOFOL
Anesthesia: General

## 2021-05-01 NOTE — Telephone Encounter (Signed)
GI determines how they bill for the colonoscopy, not me.  It is typical after a positive cologuard or other screening test that the colonoscopy is diagnostic and no longer screening due to the positive result.  I recommend that she discuss this with the GI doing the procedure.

## 2021-05-05 ENCOUNTER — Telehealth: Payer: Self-pay

## 2021-05-05 NOTE — Telephone Encounter (Signed)
Pt lmovm stating that   Patient says she was supposed to be having just a routine colonoscopy but says  was told it would be diagnostic and is being billed $3000   I called pt, no answer and Left detailed msg on VM per HIPAA Letting her know that the positive Cologuard is a diagnosis along with the screening, so it is a diagnostic

## 2021-05-12 ENCOUNTER — Telehealth: Payer: Self-pay

## 2021-05-12 NOTE — Telephone Encounter (Signed)
Patient advised as below.  

## 2021-05-12 NOTE — Telephone Encounter (Signed)
Copied from CRM [id number]. Topic: General - Other >> May 12, 2021  9:32 AM Wyonia Hough E wrote: Reason for CRM: Pt stated she was scheduled for a colonoscopy but the cost was over $3000 because it was going to be diagnostic due to cologard being positive / pt wanted to know if there is another facility she can go to that wont have that cost / pt hasnt been able to find one/ please advise

## 2021-05-12 NOTE — Telephone Encounter (Signed)
I don't know of one. It really isnt likely due to a facility charge, just how it is diagnostic due to positive cologuard.

## 2021-05-12 NOTE — Telephone Encounter (Signed)
Do you know of any facility that would charge less?

## 2021-06-14 ENCOUNTER — Telehealth: Payer: Self-pay | Admitting: Family Medicine

## 2021-06-14 DIAGNOSIS — R195 Other fecal abnormalities: Secondary | ICD-10-CM

## 2021-06-14 NOTE — Telephone Encounter (Signed)
Referral Request - Has patient seen PCP for this complaint? Yes.   *If NO, is insurance requiring patient see PCP for this issue before PCP can refer them? Referral for which specialty: GI Preferred provider/office: [facility] Reason for referral: pt had positive colo guard and needs to see GI for a colonoscopy.

## 2021-06-15 NOTE — Telephone Encounter (Signed)
Ok to send referral as requested 

## 2021-08-03 ENCOUNTER — Other Ambulatory Visit: Payer: Self-pay | Admitting: Family Medicine

## 2021-08-03 DIAGNOSIS — Z23 Encounter for immunization: Secondary | ICD-10-CM

## 2021-08-03 MED ORDER — ZOSTER VAC RECOMB ADJUVANTED 50 MCG/0.5ML IM SUSR: 0.5000 mL | Freq: Once | INTRAMUSCULAR | 0 refills | Status: AC

## 2021-08-03 NOTE — Telephone Encounter (Signed)
Rx sent, Ok to let patinet know ?

## 2021-08-03 NOTE — Telephone Encounter (Signed)
Pt is requesting shingles vaccine Rx to be sent to [facility] Address: [address] [place], [place], [place] [address] ? ?Pt requested call back with an update. ? ?

## 2023-11-12 ENCOUNTER — Encounter: Payer: Self-pay | Admitting: Family Medicine

## 2023-11-12 ENCOUNTER — Ambulatory Visit (INDEPENDENT_AMBULATORY_CARE_PROVIDER_SITE_OTHER): Admitting: Family Medicine

## 2023-11-12 ENCOUNTER — Other Ambulatory Visit (HOSPITAL_COMMUNITY)
Admission: RE | Admit: 2023-11-12 | Discharge: 2023-11-12 | Disposition: A | Discharge status: Home / Self Care | Source: Ambulatory Visit | Attending: Family Medicine | Admitting: Family Medicine

## 2023-11-12 VITALS — BP 100/66 | HR 65 | Temp 97.7°F | Ht 62.0 in | Wt 118.8 lb

## 2023-11-12 DIAGNOSIS — Z13220 Encounter for screening for lipoid disorders: Secondary | ICD-10-CM

## 2023-11-12 DIAGNOSIS — Z0000 Encounter for general adult medical examination without abnormal findings: Secondary | ICD-10-CM | POA: Diagnosis not present

## 2023-11-12 DIAGNOSIS — Z124 Encounter for screening for malignant neoplasm of cervix: Secondary | ICD-10-CM | POA: Insufficient documentation

## 2023-11-12 DIAGNOSIS — M8589 Other specified disorders of bone density and structure, multiple sites: Secondary | ICD-10-CM | POA: Insufficient documentation

## 2023-11-12 NOTE — Assessment & Plan Note (Signed)
 Osteopenia diagnosed in 2020. She has been on bone medication, taken twice daily, though adherence is inconsistent due to the requirement of an empty stomach. Improvement has been documented. Bone density scans are typically done every five years for osteopenia, and it is time for a repeat scan. - Order bone density scan.

## 2023-11-12 NOTE — Progress Notes (Signed)
 Complete physical exam   Patient: Rose Sandoval   DOB: [birth year]   63 y.o. Female  MRN: [id number] Visit Date: 11/12/2023  Today's healthcare provider: Aden Agreste, MD   Chief Complaint  Patient presents with   Annual Exam    Last CPE- 01/16/2021 Diet- General, unhealthy Exercise- Walk 4-5 days a week, 20 mins Feeling- Fine Sleeping- Pretty well, no complaints Concerns- Wanted to ask about another bone density test.  Patient had an breast ultrasound instead of Mammogram through [facility]. Cervical Cancer Screening- yes Colonoscopy- September 25, 2021; [facility] Shingles Vaccine- not sure, thinks she received one here   Subjective    Rose Sandoval is a 63 y.o. female who presents today for a complete physical exam.    Discussed the use of AI scribe software for clinical note transcription with the patient, who gave verbal consent to proceed.  History of Present Illness   Rose Sandoval is a 63 year old female who presents for an annual physical exam.  She has osteopenia and takes bone medication twice daily, though she sometimes struggles with the morning dose. She inquires about the frequency of bone density tests, with her last test in 2020.  Her family history includes breast cancer and osteoporosis. She has concerns about mammograms due to a past benign finding and is interested in genetic testing for breast cancer.  A past positive Cologuard test led to a clear colonoscopy in 2023.  In November, she fell and sustained a shoulder injury diagnosed as adhesive capsulitis and a partial rotator cuff tear, along with a fractured toe and knee injury. She reports improvement but notes the shoulder injury was significant.  She is unsure about her shingles vaccination status and is due for routine labs, with her last screening in 2021. She experiences occasional abdominal cramps when hunched over.        Last depression screening scores    11/12/2023     9:29 AM 01/16/2021   11:11 AM 11/11/2019   10:20 AM  PHQ 2/9 Scores  PHQ - 2 Score 0 0 0  PHQ- 9 Score 0 0 2   Last fall risk screening    11/12/2023    9:17 AM  Fall Risk   Falls in the past year? 1  Number falls in past yr: 0  Injury with Fall? 1  Comment Fell down steps broke her toe, knee was hurt, shoulder had crushed the top of the humerus.  Went to physical therapy but is doing well with it now.        Medications: Outpatient Medications Prior to Visit  Medication Sig   acidophilus (RISAQUAD) CAPS capsule Take by mouth daily.   Ascorbic Acid (VITA-C PO) Take by mouth.   ASHWAGANDHA PO Take by mouth daily.   BIOFLAVONOIDS PO Take by mouth daily.   CINNAMON PO Take by mouth daily.   Ginkgo Biloba (GNP GINGKO BILOBA EXTRACT PO) Take by mouth daily.   KRILL OIL PO Take by mouth daily.   Multiple Vitamins-Minerals (ZINC PO) Take by mouth.   Progesterone Micronized 10 % CREA Place 1 application onto the skin daily.   VITAMIN D PO Take by mouth.   [DISCONTINUED] Collagenase POWD by Does not apply route.   [DISCONTINUED] Magnesium Oxide POWD Take by mouth.   [DISCONTINUED] Multiple Vitamins-Minerals (ALGAE BASED CALCIUM PO) Take by mouth daily. 2 capsules   [DISCONTINUED] pyridOXINE (VITAMIN B-6) 25 MG tablet Take by mouth.   No  facility-administered medications prior to visit.    Review of Systems    Objective    BP 100/66 (BP Location: Left Arm, Patient Position: Sitting, Cuff Size: Normal)   Pulse 65   Temp 97.7 F (36.5 C) (Oral)   Ht 5\' 2"  (1.575 m)   Wt 118 lb 12.8 oz (53.9 kg)   SpO2 98%   BMI 21.73 kg/m    Physical Exam Vitals reviewed.  Constitutional:      General: She is not in acute distress.    Appearance: Normal appearance. She is well-developed. She is not diaphoretic.  HENT:     Head: Normocephalic and atraumatic.     Right Ear: Tympanic membrane, ear canal and external ear normal.     Left Ear: Tympanic membrane, ear canal and external  ear normal.     Nose: Nose normal.     Mouth/Throat:     Mouth: Mucous membranes are moist.     Pharynx: Oropharynx is clear. No oropharyngeal exudate.  Eyes:     General: No scleral icterus.    Conjunctiva/sclera: Conjunctivae normal.     Pupils: Pupils are equal, round, and reactive to light.  Neck:     Thyroid : No thyromegaly.  Cardiovascular:     Rate and Rhythm: Normal rate and regular rhythm.     Heart sounds: Normal heart sounds. No murmur heard. Pulmonary:     Effort: Pulmonary effort is normal. No respiratory distress.     Breath sounds: Normal breath sounds. No wheezing or rales.  Abdominal:     General: There is no distension.     Palpations: Abdomen is soft.     Tenderness: There is no abdominal tenderness.  Genitourinary:    Comments: GYN:  External genitalia within normal limits.  Vaginal mucosa pink, moist, normal rugae.  Nonfriable cervix without lesions, no discharge or bleeding noted on speculum exam.    Musculoskeletal:        General: No deformity.     Cervical back: Neck supple.     Right lower leg: No edema.     Left lower leg: No edema.  Lymphadenopathy:     Cervical: No cervical adenopathy.  Skin:    General: Skin is warm and dry.     Findings: No rash.  Neurological:     Mental Status: She is alert and oriented to person, place, and time. Mental status is at baseline.     Gait: Gait normal.  Psychiatric:        Mood and Affect: Mood normal.        Behavior: Behavior normal.        Thought Content: Thought content normal.      No results found for any visits on 11/12/23.  Assessment & Plan    Routine Health Maintenance and Physical Exam  Exercise Activities and Dietary recommendations  Goals   None     Immunization History  Administered Date(s) Administered   Tdap 11/11/2019    Health Maintenance  Topic Date Due   Colonoscopy  Never done   Zoster Vaccines- Shingrix (1 of 2) Never done   MAMMOGRAM  12/02/2020   COVID-19 Vaccine (1  - 2024-25 season) Never done   Cervical Cancer Screening (HPV/Pap Cotest)  11/10/2023   DEXA SCAN  11/21/2023   INFLUENZA VACCINE  01/03/2024   DTaP/Tdap/Td (2 - Td or Tdap) 11/10/2029   Hepatitis C Screening  Completed   HIV Screening  Completed   HPV VACCINES  Aged Out  Meningococcal B Vaccine  Aged Out    Discussed health benefits of physical activity, and encouraged her to engage in regular exercise appropriate for her age and condition.  Problem List Items Addressed This Visit       Musculoskeletal and Integument   Osteopenia of multiple sites   Osteopenia diagnosed in 2020. She has been on bone medication, taken twice daily, though adherence is inconsistent due to the requirement of an empty stomach. Improvement has been documented. Bone density scans are typically done every five years for osteopenia, and it is time for a repeat scan. - Order bone density scan.      Relevant Orders   DG Bone Density   VITAMIN D 25 Hydroxy (Vit-D Deficiency, Fractures)   Other Visit Diagnoses       Encounter for annual physical exam    -  Primary   Relevant Orders   Comprehensive metabolic panel with GFR   VITAMIN D 25 Hydroxy (Vit-D Deficiency, Fractures)   Lipid panel     Cervical cancer screening       Relevant Orders   Cytology - PAP     Screening for lipid disorders       Relevant Orders   Comprehensive metabolic panel with GFR   Lipid panel           Humeral fracture She experienced a fall in November resulting in a shoulder injury. Reports doing well without surgery.  Breast Cancer Screening She has a family history of breast cancer and expressed a preference for ultrasound over mammogram due to past experiences and concerns about mammograms. It was explained that mammograms are superior to ultrasounds for breast cancer screening according to the Preventive Services Task Force. Insurance and medical records will still indicate the need for a mammogram. - Discuss breast  cancer screening options with her.  Colorectal Cancer Screening She had a positive Cologuard test followed by a colonoscopy in 2023, which was clear. The colonoscopy was performed at [facility], and the records need to be obtained for her chart. - Request colonoscopy records from [facility].  Cervical Cancer Screening She is due for a Pap smear today. If normal, this will be the last Pap smear needed as she is 63 years old and has had normal results previously. Pap smears and HPV testing have improved, allowing for less frequent screening. - Perform Pap smear.  General Health Maintenance Discussion on genetic testing for breast cancer, cholesterol, and Lynch syndrome. She is considering participation in a genetic study. The study offers free testing for three genetic markers and provides counseling if results are positive. Shingles vaccination status is unclear, and it is recommended at age 46. Last labs were done in 2021, and it is time for updated screening labs. - Discuss genetic testing study with her. - Check shingles vaccination status. - Order screening labs including kidney and liver function, electrolytes, blood sugar, vitamin D, and cholesterol.        Return in about 1 year (around 11/11/2024) for CPE.     Aden Agreste, MD  [facility] [phone] (phone) [phone] (fax)  [facility]

## 2023-11-13 ENCOUNTER — Encounter: Payer: Self-pay | Admitting: Family Medicine

## 2023-11-13 ENCOUNTER — Ambulatory Visit: Payer: Self-pay | Admitting: Family Medicine

## 2023-11-13 LAB — COMPREHENSIVE METABOLIC PANEL WITH GFR
ALT: 18 IU/L (ref 0–32)
AST: 23 IU/L (ref 0–40)
Albumin: 4.3 g/dL (ref 3.9–4.9)
Alkaline Phosphatase: 69 IU/L (ref 44–121)
BUN/Creatinine Ratio: 13 (ref 12–28)
BUN: 11 mg/dL (ref 8–27)
Bilirubin Total: 0.4 mg/dL (ref 0.0–1.2)
CO2: 22 mmol/L (ref 20–29)
Calcium: 9.6 mg/dL (ref 8.7–10.3)
Chloride: 102 mmol/L (ref 96–106)
Creatinine, Ser: 0.83 mg/dL (ref 0.57–1.00)
Globulin, Total: 2.8 g/dL (ref 1.5–4.5)
Glucose: 86 mg/dL (ref 70–99)
Potassium: 4.2 mmol/L (ref 3.5–5.2)
Sodium: 138 mmol/L (ref 134–144)
Total Protein: 7.1 g/dL (ref 6.0–8.5)
eGFR: 79 mL/min/{1.73_m2} (ref >59)

## 2023-11-13 LAB — LIPID PANEL
Chol/HDL Ratio: 3.3 ratio (ref 0.0–4.4)
Cholesterol, Total: 239 mg/dL — ABNORMAL HIGH (ref 100–199)
HDL: 73 mg/dL (ref >39)
LDL Chol Calc (NIH): 153 mg/dL — ABNORMAL HIGH (ref 0–99)
Triglycerides: 77 mg/dL (ref 0–149)
VLDL Cholesterol Cal: 13 mg/dL (ref 5–40)

## 2023-11-13 LAB — VITAMIN D 25 HYDROXY (VIT D DEFICIENCY, FRACTURES): Vit D, 25-Hydroxy: 111 ng/mL — ABNORMAL HIGH (ref 30.0–100.0)

## 2023-11-18 LAB — CYTOLOGY - PAP
Comment: NEGATIVE
Diagnosis: NEGATIVE
High risk HPV: NEGATIVE

## 2024-02-10 ENCOUNTER — Telehealth: Payer: Self-pay | Admitting: Family Medicine

## 2024-02-10 NOTE — Telephone Encounter (Signed)
 Copied from CRM [id number]. Topic: Appointments - Scheduling Inquiry for Clinic >> Feb 10, 2024  2:53 PM Chasity T wrote: Reason for CRM: Pt is calling to schedule bone density appointment. Please call back to schedule

## 2024-02-11 ENCOUNTER — Ambulatory Visit
Admission: RE | Admit: 2024-02-11 | Discharge: 2024-02-11 | Disposition: A | Discharge status: Home / Self Care | Source: Ambulatory Visit | Attending: Family Medicine | Admitting: Family Medicine

## 2024-02-11 DIAGNOSIS — M8589 Other specified disorders of bone density and structure, multiple sites: Secondary | ICD-10-CM | POA: Diagnosis present

## 2024-11-16 ENCOUNTER — Encounter: Admitting: Family Medicine
